# Patient Record
Sex: Male | Born: 1955
Health system: Southern US, Community
[De-identification: ages and names within clinical notes are randomized; demographics above are authoritative.]

---

## 2009-03-29 ENCOUNTER — Emergency Department (HOSPITAL_BASED_OUTPATIENT_CLINIC_OR_DEPARTMENT_OTHER): Admission: EM | Admit: 2009-03-29 | Discharge: 2009-03-29 | Payer: Self-pay | Admitting: Emergency Medicine

## 2009-03-29 ENCOUNTER — Ambulatory Visit: Payer: Self-pay | Admitting: Diagnostic Radiology

## 2010-01-30 IMAGING — CR DG CHEST 1V
1 series · 1 of 1 positions shown · non-contrast
Comparison: Portable chest obtained earlier today.

CLINICAL DATA: Mid chest pain and nausea.

CHEST - 1 VIEW

[w chest lat]
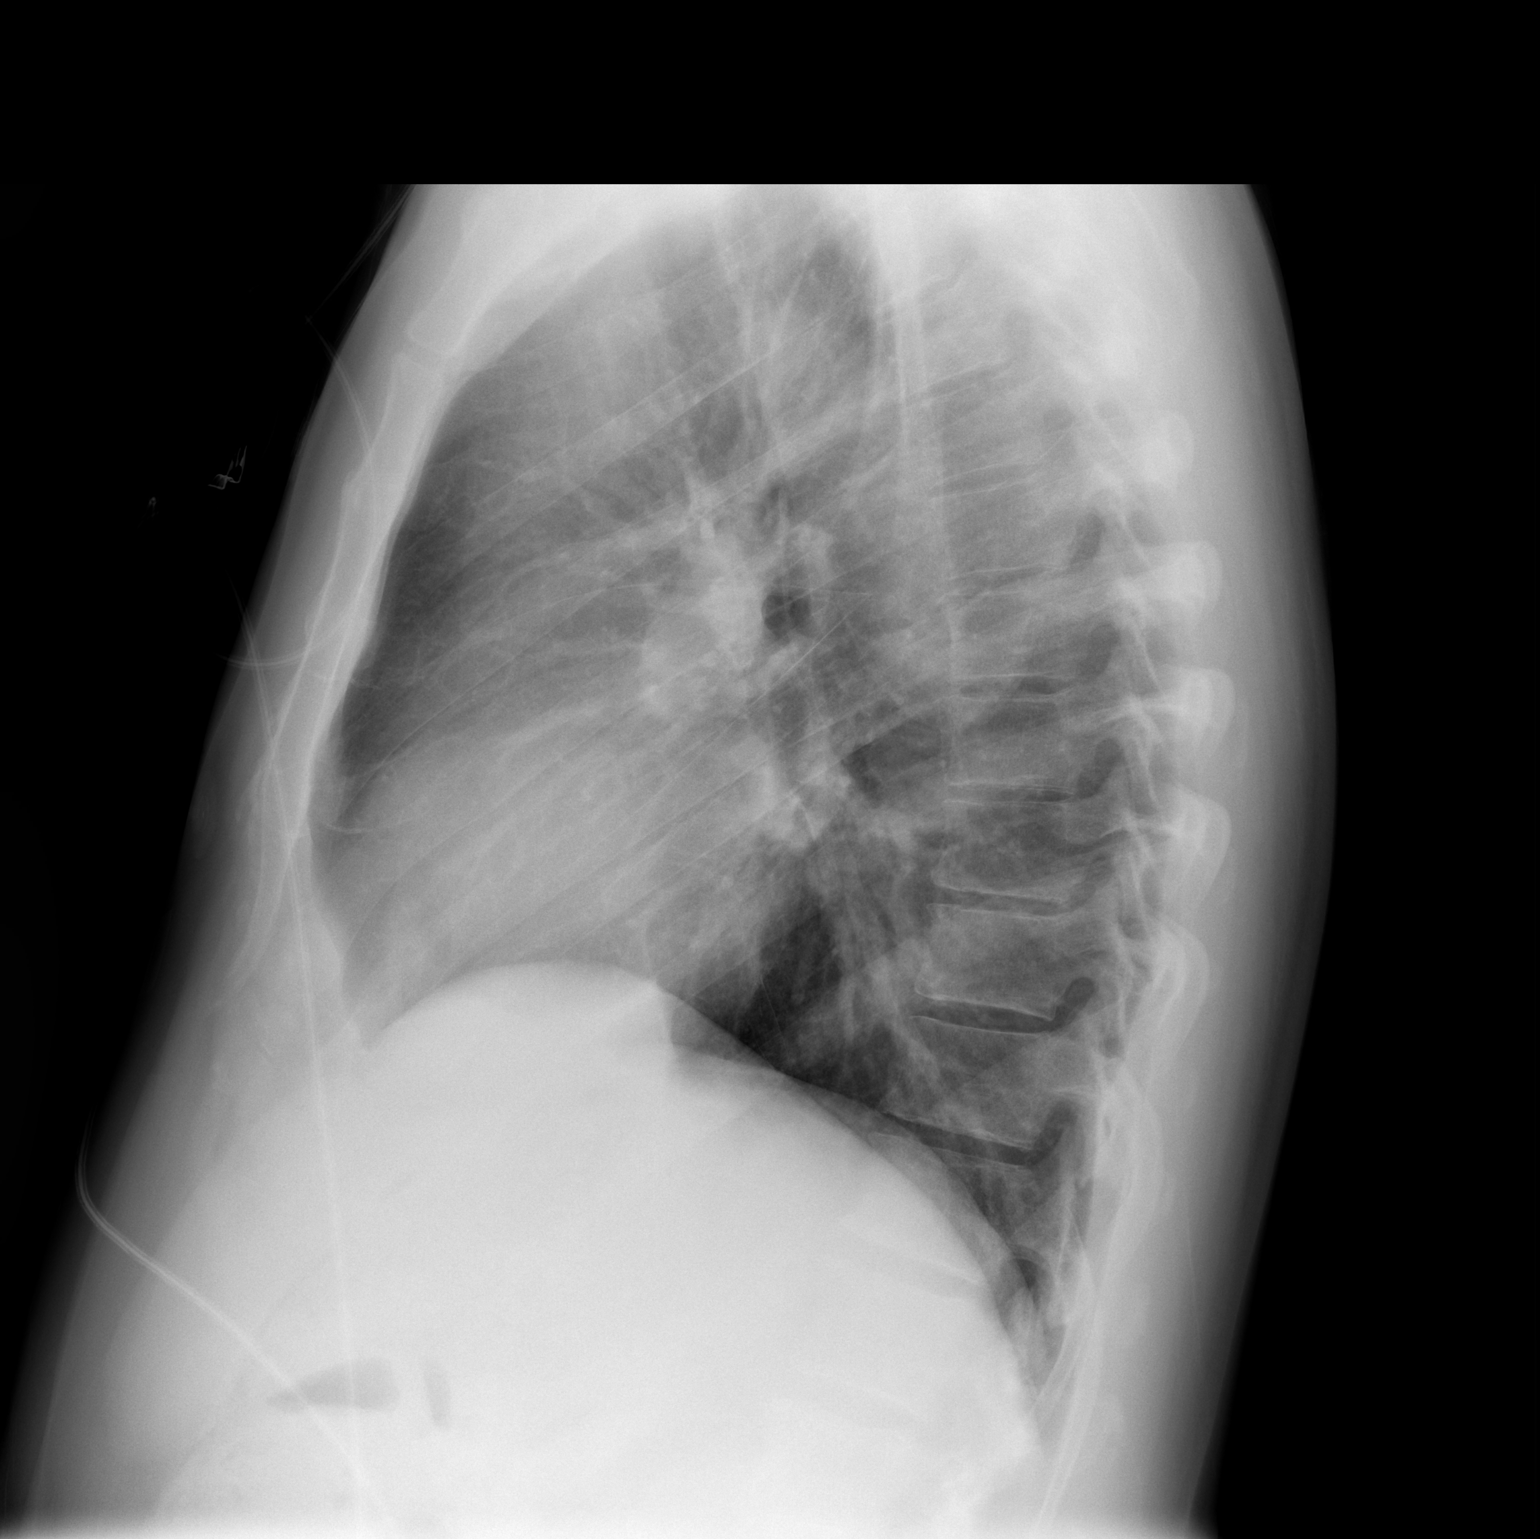

[1 of 1 positions shown; findings below may reference images not displayed]

FINDINGS: A lateral view of the chest demonstrates clear lungs and
normal appearing bones.  Normal sized heart.
IMPRESSION: Normal examination.

## 2011-02-01 LAB — CBC
Hemoglobin: 14.3 g/dL (ref 13.0–17.0)
MCHC: 33.7 g/dL (ref 30.0–36.0)
MCV: 87.1 fL (ref 78.0–100.0)
RBC: 4.87 MIL/uL (ref 4.22–5.81)
WBC: 13.2 10*3/uL — ABNORMAL HIGH (ref 4.0–10.5)

## 2011-02-01 LAB — D-DIMER, QUANTITATIVE: D-Dimer, Quant: <0.22 ug/mL-FEU (ref 0.00–0.48)

## 2011-02-01 LAB — DIFFERENTIAL
Basophils Absolute: 0.1 10*3/uL (ref 0.0–0.1)
Eosinophils Absolute: 0.1 10*3/uL (ref 0.0–0.7)
Eosinophils Relative: 1 % (ref 0–5)
Lymphocytes Relative: 11 % — ABNORMAL LOW (ref 12–46)
Lymphs Abs: 1.4 10*3/uL (ref 0.7–4.0)
Neutrophils Relative %: 81 % — ABNORMAL HIGH (ref 43–77)

## 2011-02-01 LAB — POCT CARDIAC MARKERS: CKMB, poc: <1 ng/mL — ABNORMAL LOW (ref 1.0–8.0)

## 2011-02-01 LAB — COMPREHENSIVE METABOLIC PANEL
ALT: 19 U/L (ref 0–53)
CO2: 27 mEq/L (ref 19–32)
Calcium: 8.7 mg/dL (ref 8.4–10.5)
Chloride: 105 mEq/L (ref 96–112)
Creatinine, Ser: 1 mg/dL (ref 0.4–1.5)
GFR calc non Af Amer: >60 mL/min (ref >60)
Glucose, Bld: 134 mg/dL — ABNORMAL HIGH (ref 70–99)
Total Bilirubin: 0.4 mg/dL (ref 0.3–1.2)

## 2011-02-01 LAB — LIPASE, BLOOD: Lipase: 65 U/L (ref 23–300)

## 2017-02-03 DIAGNOSIS — M25551 Pain in right hip: Secondary | ICD-10-CM | POA: Diagnosis not present

## 2017-02-03 DIAGNOSIS — R05 Cough: Secondary | ICD-10-CM | POA: Diagnosis not present

## 2017-02-05 DIAGNOSIS — M25551 Pain in right hip: Secondary | ICD-10-CM | POA: Diagnosis not present

## 2017-02-07 DIAGNOSIS — S76012D Strain of muscle, fascia and tendon of left hip, subsequent encounter: Secondary | ICD-10-CM | POA: Diagnosis not present

## 2017-02-10 DIAGNOSIS — M25551 Pain in right hip: Secondary | ICD-10-CM | POA: Diagnosis not present

## 2017-02-12 DIAGNOSIS — M25551 Pain in right hip: Secondary | ICD-10-CM | POA: Diagnosis not present

## 2017-02-14 DIAGNOSIS — M7601 Gluteal tendinitis, right hip: Secondary | ICD-10-CM | POA: Diagnosis not present

## 2017-02-14 DIAGNOSIS — M7061 Trochanteric bursitis, right hip: Secondary | ICD-10-CM | POA: Diagnosis not present

## 2017-02-14 DIAGNOSIS — M25551 Pain in right hip: Secondary | ICD-10-CM | POA: Diagnosis not present

## 2017-02-22 DIAGNOSIS — M25551 Pain in right hip: Secondary | ICD-10-CM | POA: Diagnosis not present

## 2017-02-23 DIAGNOSIS — M25551 Pain in right hip: Secondary | ICD-10-CM | POA: Diagnosis not present

## 2017-02-23 DIAGNOSIS — R6889 Other general symptoms and signs: Secondary | ICD-10-CM | POA: Diagnosis not present

## 2017-02-28 DIAGNOSIS — R6889 Other general symptoms and signs: Secondary | ICD-10-CM | POA: Diagnosis not present

## 2017-02-28 DIAGNOSIS — M25551 Pain in right hip: Secondary | ICD-10-CM | POA: Diagnosis not present

## 2017-03-01 DIAGNOSIS — M25551 Pain in right hip: Secondary | ICD-10-CM | POA: Diagnosis not present

## 2017-03-02 DIAGNOSIS — Z1211 Encounter for screening for malignant neoplasm of colon: Secondary | ICD-10-CM | POA: Diagnosis not present

## 2017-03-02 DIAGNOSIS — R0683 Snoring: Secondary | ICD-10-CM | POA: Diagnosis not present

## 2017-03-02 DIAGNOSIS — R5383 Other fatigue: Secondary | ICD-10-CM | POA: Diagnosis not present

## 2017-03-02 DIAGNOSIS — R7989 Other specified abnormal findings of blood chemistry: Secondary | ICD-10-CM | POA: Diagnosis not present

## 2017-03-02 DIAGNOSIS — R7303 Prediabetes: Secondary | ICD-10-CM | POA: Diagnosis not present

## 2017-03-02 DIAGNOSIS — E039 Hypothyroidism, unspecified: Secondary | ICD-10-CM | POA: Diagnosis not present

## 2017-03-02 DIAGNOSIS — E669 Obesity, unspecified: Secondary | ICD-10-CM | POA: Diagnosis not present

## 2017-03-17 DIAGNOSIS — M25551 Pain in right hip: Secondary | ICD-10-CM | POA: Diagnosis not present

## 2017-03-17 DIAGNOSIS — R6889 Other general symptoms and signs: Secondary | ICD-10-CM | POA: Diagnosis not present

## 2017-03-29 DIAGNOSIS — R6889 Other general symptoms and signs: Secondary | ICD-10-CM | POA: Diagnosis not present

## 2017-03-29 DIAGNOSIS — M25551 Pain in right hip: Secondary | ICD-10-CM | POA: Diagnosis not present

## 2017-03-30 DIAGNOSIS — M5127 Other intervertebral disc displacement, lumbosacral region: Secondary | ICD-10-CM | POA: Diagnosis not present

## 2017-03-30 DIAGNOSIS — M47816 Spondylosis without myelopathy or radiculopathy, lumbar region: Secondary | ICD-10-CM | POA: Diagnosis not present

## 2017-03-30 DIAGNOSIS — M5135 Other intervertebral disc degeneration, thoracolumbar region: Secondary | ICD-10-CM | POA: Diagnosis not present

## 2017-03-30 DIAGNOSIS — M545 Low back pain: Secondary | ICD-10-CM | POA: Diagnosis not present

## 2017-03-30 DIAGNOSIS — M5125 Other intervertebral disc displacement, thoracolumbar region: Secondary | ICD-10-CM | POA: Diagnosis not present

## 2017-04-07 DIAGNOSIS — M545 Low back pain: Secondary | ICD-10-CM | POA: Diagnosis not present

## 2017-04-07 DIAGNOSIS — M25551 Pain in right hip: Secondary | ICD-10-CM | POA: Diagnosis not present

## 2017-04-07 DIAGNOSIS — M5416 Radiculopathy, lumbar region: Secondary | ICD-10-CM | POA: Diagnosis not present

## 2017-04-08 DIAGNOSIS — M5416 Radiculopathy, lumbar region: Secondary | ICD-10-CM | POA: Diagnosis not present

## 2017-04-26 DIAGNOSIS — M5416 Radiculopathy, lumbar region: Secondary | ICD-10-CM | POA: Diagnosis not present

## 2017-04-26 DIAGNOSIS — M545 Low back pain: Secondary | ICD-10-CM | POA: Diagnosis not present

## 2017-05-09 DIAGNOSIS — M5416 Radiculopathy, lumbar region: Secondary | ICD-10-CM | POA: Diagnosis not present

## 2017-05-16 DIAGNOSIS — M545 Low back pain: Secondary | ICD-10-CM | POA: Diagnosis not present

## 2017-05-16 DIAGNOSIS — M5126 Other intervertebral disc displacement, lumbar region: Secondary | ICD-10-CM | POA: Diagnosis not present

## 2017-06-01 DIAGNOSIS — M545 Low back pain: Secondary | ICD-10-CM | POA: Diagnosis not present

## 2017-06-01 DIAGNOSIS — M5126 Other intervertebral disc displacement, lumbar region: Secondary | ICD-10-CM | POA: Diagnosis not present

## 2017-06-02 DIAGNOSIS — R7989 Other specified abnormal findings of blood chemistry: Secondary | ICD-10-CM | POA: Diagnosis not present

## 2017-06-02 DIAGNOSIS — Z0000 Encounter for general adult medical examination without abnormal findings: Secondary | ICD-10-CM | POA: Diagnosis not present

## 2017-06-07 DIAGNOSIS — M545 Low back pain: Secondary | ICD-10-CM | POA: Diagnosis not present

## 2017-06-07 DIAGNOSIS — M5126 Other intervertebral disc displacement, lumbar region: Secondary | ICD-10-CM | POA: Diagnosis not present

## 2017-06-17 DIAGNOSIS — M545 Low back pain: Secondary | ICD-10-CM | POA: Diagnosis not present

## 2017-06-17 DIAGNOSIS — M5126 Other intervertebral disc displacement, lumbar region: Secondary | ICD-10-CM | POA: Diagnosis not present

## 2017-06-20 DIAGNOSIS — M545 Low back pain: Secondary | ICD-10-CM | POA: Diagnosis not present

## 2017-06-20 DIAGNOSIS — M5126 Other intervertebral disc displacement, lumbar region: Secondary | ICD-10-CM | POA: Diagnosis not present

## 2017-06-29 DIAGNOSIS — M545 Low back pain: Secondary | ICD-10-CM | POA: Diagnosis not present

## 2017-06-29 DIAGNOSIS — M5126 Other intervertebral disc displacement, lumbar region: Secondary | ICD-10-CM | POA: Diagnosis not present

## 2017-06-29 DIAGNOSIS — M25551 Pain in right hip: Secondary | ICD-10-CM | POA: Diagnosis not present

## 2017-07-01 DIAGNOSIS — M5126 Other intervertebral disc displacement, lumbar region: Secondary | ICD-10-CM | POA: Diagnosis not present

## 2017-07-01 DIAGNOSIS — M545 Low back pain: Secondary | ICD-10-CM | POA: Diagnosis not present

## 2017-07-01 DIAGNOSIS — M25551 Pain in right hip: Secondary | ICD-10-CM | POA: Diagnosis not present

## 2017-07-13 DIAGNOSIS — M5126 Other intervertebral disc displacement, lumbar region: Secondary | ICD-10-CM | POA: Diagnosis not present

## 2017-07-13 DIAGNOSIS — M25551 Pain in right hip: Secondary | ICD-10-CM | POA: Diagnosis not present

## 2017-07-13 DIAGNOSIS — M545 Low back pain: Secondary | ICD-10-CM | POA: Diagnosis not present

## 2017-07-14 ENCOUNTER — Ambulatory Visit (INDEPENDENT_AMBULATORY_CARE_PROVIDER_SITE_OTHER): Payer: BLUE CROSS/BLUE SHIELD | Admitting: Sports Medicine

## 2017-07-14 ENCOUNTER — Ambulatory Visit: Payer: Self-pay

## 2017-07-14 VITALS — BP 140/82 | Ht 73.0 in | Wt 232.0 lb

## 2017-07-14 DIAGNOSIS — M25551 Pain in right hip: Secondary | ICD-10-CM | POA: Diagnosis not present

## 2017-07-14 DIAGNOSIS — M5136 Other intervertebral disc degeneration, lumbar region: Secondary | ICD-10-CM | POA: Diagnosis not present

## 2017-07-14 DIAGNOSIS — M67951 Unspecified disorder of synovium and tendon, right thigh: Secondary | ICD-10-CM

## 2017-07-14 DIAGNOSIS — M6798 Unspecified disorder of synovium and tendon, other site: Secondary | ICD-10-CM

## 2017-07-14 MED ORDER — NITROGLYCERIN 0.2 MG/HR TD PT24: MEDICATED_PATCH | TRANSDERMAL | 1 refills | Status: DC

## 2017-07-14 NOTE — Patient Instructions (Signed)
Nitroglycerin Protocol   Apply 1/4 nitroglycerin patch to affected area daily.  Change position of patch within the affected area every 24 hours.  You may experience a headache during the first 1-2 weeks of using the patch, these should subside.  If you experience headaches after beginning nitroglycerin patch treatment, you may take your preferred over the counter pain reliever.  Another side effect of the nitroglycerin patch is skin irritation or rash related to patch adhesive.  Please notify our office if you develop more severe headaches or rash, and stop the patch.  Tendon healing with nitroglycerin patch may require 12 to 24 weeks depending on the extent of injury.  Men should not use if taking Viagra, Cialis, or Levitra.   Do not use if you have migraines or rosacea.   Do the home exercises as prescribed for your hip  Follow-up with [place] in 6 weeks

## 2017-07-14 NOTE — Assessment & Plan Note (Signed)
I advised him that I thought this has not healed since original MRI  Start HEP program to emphasixe GM rehab Topical NTG protocol OK to walk and do normal activity Repeat US in 6 weeks   If bursal swelling persists try CSI  I spent 40 minutes with this patient. Over 50% of visit was spend in counseling and coordination of care for problems with right lateral hip pain and extensive medical workup of his lumbar spine.

## 2017-07-14 NOTE — Assessment & Plan Note (Signed)
While I think he likely did have a ruptured disc acutely his current sxs do not seem to me to be arising from his lumbar spine  I don't think the PT for this has helped or the injections

## 2017-07-14 NOTE — Progress Notes (Signed)
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 Was trying to get into shape for some events Running step 6 mos ago Tightness in lateral hip Tried to do standing hip rotation swimgs He got acute onset of severe pain   This was bad enough to go to urgent care Difficult to walk upright Cough and sneeze pain Prednisone dose pack - great relief  Evaluated [facility] [place] after pain returned Had MRI of back Herniated disc complex but this was L4/5 Degenerative change in lumbar spine MRI did suggest some GM/ GM tendinopathy  PT for back not helpful Directed Injections to spine did not help Dry needling did not help  Comes for my opinion  Past Hx Heart disease - now stable per patient  ROS Burning down his upper latearl RT hip Does not feel week No groin pain  PE Obese M in NAD/ muscular BP 140/82   Ht  (1.854 m)   Wt 232 lb (105.2 kg)   BMI 30.61 kg/m    Full ROM RT hip Excellent strength of hip flexion/ rotation/ extension Mild TTP on GT on RT Weakness of hip abduction  BAck motion is normal Pain free with back positions and SLR Toe walk Heel walk Tandem walk pain free  Lateral step up and cross over step up cause mild pain  Data reviewed; 6 mos medical records MRI images Injection fluoro  Ultrasound of Right lateral hip  At iliac crest mild swelling of insertion of Gluteus medius Edema in distal gluteus medius Mild edema in gluteus minimus Bursal swelling at insertion of gluteus medius to greater trochanter  Impression gluteus medius chronic tendinopathy with some evidence of bursitis  Ultrasound and interpretation by Peter Huffman. Peter Penna, MD

## 2017-07-19 ENCOUNTER — Encounter: Payer: Self-pay | Admitting: Sports Medicine

## 2017-08-01 DIAGNOSIS — M5126 Other intervertebral disc displacement, lumbar region: Secondary | ICD-10-CM | POA: Diagnosis not present

## 2017-08-04 DIAGNOSIS — M545 Low back pain: Secondary | ICD-10-CM | POA: Diagnosis not present

## 2017-08-04 DIAGNOSIS — M5126 Other intervertebral disc displacement, lumbar region: Secondary | ICD-10-CM | POA: Diagnosis not present

## 2017-08-10 DIAGNOSIS — M25551 Pain in right hip: Secondary | ICD-10-CM | POA: Diagnosis not present

## 2017-08-10 DIAGNOSIS — M5126 Other intervertebral disc displacement, lumbar region: Secondary | ICD-10-CM | POA: Diagnosis not present

## 2017-08-15 DIAGNOSIS — M545 Low back pain: Secondary | ICD-10-CM | POA: Diagnosis not present

## 2017-08-15 DIAGNOSIS — M5126 Other intervertebral disc displacement, lumbar region: Secondary | ICD-10-CM | POA: Diagnosis not present

## 2017-08-15 DIAGNOSIS — M25551 Pain in right hip: Secondary | ICD-10-CM | POA: Diagnosis not present

## 2017-08-19 DIAGNOSIS — M5126 Other intervertebral disc displacement, lumbar region: Secondary | ICD-10-CM | POA: Diagnosis not present

## 2017-08-19 DIAGNOSIS — R29898 Other symptoms and signs involving the musculoskeletal system: Secondary | ICD-10-CM | POA: Diagnosis not present

## 2017-08-19 DIAGNOSIS — M25551 Pain in right hip: Secondary | ICD-10-CM | POA: Diagnosis not present

## 2017-08-23 DIAGNOSIS — M25551 Pain in right hip: Secondary | ICD-10-CM | POA: Diagnosis not present

## 2017-08-23 DIAGNOSIS — R29898 Other symptoms and signs involving the musculoskeletal system: Secondary | ICD-10-CM | POA: Diagnosis not present

## 2017-08-23 DIAGNOSIS — M5126 Other intervertebral disc displacement, lumbar region: Secondary | ICD-10-CM | POA: Diagnosis not present

## 2017-08-25 ENCOUNTER — Ambulatory Visit (INDEPENDENT_AMBULATORY_CARE_PROVIDER_SITE_OTHER): Payer: BLUE CROSS/BLUE SHIELD | Admitting: Sports Medicine

## 2017-08-25 DIAGNOSIS — M5136 Other intervertebral disc degeneration, lumbar region: Secondary | ICD-10-CM

## 2017-08-25 DIAGNOSIS — M6798 Unspecified disorder of synovium and tendon, other site: Secondary | ICD-10-CM

## 2017-08-25 DIAGNOSIS — M67951 Unspecified disorder of synovium and tendon, right thigh: Secondary | ICD-10-CM

## 2017-08-25 MED ORDER — NITROGLYCERIN 0.2 MG/HR TD PT24: MEDICATED_PATCH | TRANSDERMAL | 1 refills | Status: DC

## 2017-08-25 NOTE — Progress Notes (Signed)
Peter Huffman is a 61 y.o. male with a history of right gluteus medius tendinopathy (on US and MRI) and lumbar degenerative disk disease (L4/5 herniated disk complex)  who presents for a follow up. He is an avid Armed forces operational officertennis player.   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 Pain used to be 9/10 and is now 1-3/10 in intensity. He is greatly improving with PT twice weekly and he is doing exercises at home. He gets ischemic therapy at PT, as well. Not sure if nitrogolycerin is helping with the pain but would like a refill. He notes that he is better able to move on the tennis court and that his follow through is less painful, but he feels there is more room for improvement. He denies numbness, tingling, shooting pains.   Patient notes that he has had "tenting" of his abdominal muscles when he lays down at night. No abdominal pain, no changes in bowel habits. Endorses 20 lb weight gain since March due to decreased activity.   O:  Gen: Appears well Ab: soft, NTND, tenting of rectus when laying down and flexing abdomen; + umbilical hernia that is reducible / diastasis recti noted  Right Hip -75 degree arc between full internal and external rotation -strength of gluteus medius, maximus, and tensor fascia lata 5/5 -strength in adduction 5/5 FABER limited (about 1.5 hand widths from table) but equal bilaterally MSK, other:  -5/5 strength b/l knee flexion and extension -5/5 strength b/l ankle flexion and extension Neuro: light touch sensation intact and equal in lower extremities  A/P: 61 y.o. male with right gluteus medius tendinopathy and lumbar degenerative disk disease who has greatly improved strength and pain with current PT and NTG patch regimen, though not back to baseline. Also with notable weight gain due to limited activity and consequent diastasis recti and soft tissues around  the umbilicus. Patient would benefit from continued PT and tennis-specific exercises. RTC 4-6 weeks if still in pain.   -NTG patches reordered  -Continue PT; told patient to ask about exercises to help strengthen abdominal wall.  -Tennis specific exercises reviewed -abdominal wall isometric exercises reviewed -Focus on diet changes and exercises to lose weight  I observed and examined the patient with the resident and agree with assessment and plan.  Note reviewed and modified by me. Enid BaasKarl Adar Rase, MD

## 2017-08-25 NOTE — Assessment & Plan Note (Signed)
Bif improvement with HEP/ NTG and now formal PT  Cont this regimen  If not resolved in 2 mos RTC

## 2017-08-25 NOTE — Assessment & Plan Note (Signed)
Stable  I suggested beginning some abdominal exercises, pelvic tilts, flexion stretches as a part of maintenance  Not symptomatic today/  Exam is OK

## 2017-08-27 DIAGNOSIS — M5126 Other intervertebral disc displacement, lumbar region: Secondary | ICD-10-CM | POA: Diagnosis not present

## 2017-08-27 DIAGNOSIS — M25551 Pain in right hip: Secondary | ICD-10-CM | POA: Diagnosis not present

## 2017-08-27 DIAGNOSIS — R29898 Other symptoms and signs involving the musculoskeletal system: Secondary | ICD-10-CM | POA: Diagnosis not present

## 2017-08-27 DIAGNOSIS — M545 Low back pain: Secondary | ICD-10-CM | POA: Diagnosis not present

## 2017-08-30 DIAGNOSIS — M545 Low back pain: Secondary | ICD-10-CM | POA: Diagnosis not present

## 2017-08-30 DIAGNOSIS — M5126 Other intervertebral disc displacement, lumbar region: Secondary | ICD-10-CM | POA: Diagnosis not present

## 2017-08-30 DIAGNOSIS — R29898 Other symptoms and signs involving the musculoskeletal system: Secondary | ICD-10-CM | POA: Diagnosis not present

## 2017-08-30 DIAGNOSIS — M25551 Pain in right hip: Secondary | ICD-10-CM | POA: Diagnosis not present

## 2017-09-06 DIAGNOSIS — M5126 Other intervertebral disc displacement, lumbar region: Secondary | ICD-10-CM | POA: Diagnosis not present

## 2017-09-06 DIAGNOSIS — M25551 Pain in right hip: Secondary | ICD-10-CM | POA: Diagnosis not present

## 2017-09-06 DIAGNOSIS — R29898 Other symptoms and signs involving the musculoskeletal system: Secondary | ICD-10-CM | POA: Diagnosis not present

## 2017-09-07 DIAGNOSIS — L820 Inflamed seborrheic keratosis: Secondary | ICD-10-CM | POA: Diagnosis not present

## 2017-09-07 DIAGNOSIS — Z872 Personal history of diseases of the skin and subcutaneous tissue: Secondary | ICD-10-CM | POA: Diagnosis not present

## 2017-09-07 DIAGNOSIS — R209 Unspecified disturbances of skin sensation: Secondary | ICD-10-CM | POA: Diagnosis not present

## 2017-09-07 DIAGNOSIS — L814 Other melanin hyperpigmentation: Secondary | ICD-10-CM | POA: Diagnosis not present

## 2017-09-07 DIAGNOSIS — L570 Actinic keratosis: Secondary | ICD-10-CM | POA: Diagnosis not present

## 2017-09-07 DIAGNOSIS — L821 Other seborrheic keratosis: Secondary | ICD-10-CM | POA: Diagnosis not present

## 2017-09-07 DIAGNOSIS — R238 Other skin changes: Secondary | ICD-10-CM | POA: Diagnosis not present

## 2017-09-07 DIAGNOSIS — D485 Neoplasm of uncertain behavior of skin: Secondary | ICD-10-CM | POA: Diagnosis not present

## 2017-09-07 DIAGNOSIS — D2261 Melanocytic nevi of right upper limb, including shoulder: Secondary | ICD-10-CM | POA: Diagnosis not present

## 2017-09-07 DIAGNOSIS — Z789 Other specified health status: Secondary | ICD-10-CM | POA: Diagnosis not present

## 2017-09-09 DIAGNOSIS — M5126 Other intervertebral disc displacement, lumbar region: Secondary | ICD-10-CM | POA: Diagnosis not present

## 2017-09-09 DIAGNOSIS — R29898 Other symptoms and signs involving the musculoskeletal system: Secondary | ICD-10-CM | POA: Diagnosis not present

## 2017-09-09 DIAGNOSIS — M25551 Pain in right hip: Secondary | ICD-10-CM | POA: Diagnosis not present

## 2017-09-13 DIAGNOSIS — R29898 Other symptoms and signs involving the musculoskeletal system: Secondary | ICD-10-CM | POA: Diagnosis not present

## 2017-09-13 DIAGNOSIS — M25551 Pain in right hip: Secondary | ICD-10-CM | POA: Diagnosis not present

## 2017-09-13 DIAGNOSIS — M5126 Other intervertebral disc displacement, lumbar region: Secondary | ICD-10-CM | POA: Diagnosis not present

## 2017-09-13 DIAGNOSIS — M545 Low back pain: Secondary | ICD-10-CM | POA: Diagnosis not present

## 2017-09-20 DIAGNOSIS — M545 Low back pain: Secondary | ICD-10-CM | POA: Diagnosis not present

## 2017-09-20 DIAGNOSIS — M5126 Other intervertebral disc displacement, lumbar region: Secondary | ICD-10-CM | POA: Diagnosis not present

## 2017-09-20 DIAGNOSIS — R29898 Other symptoms and signs involving the musculoskeletal system: Secondary | ICD-10-CM | POA: Diagnosis not present

## 2017-09-20 DIAGNOSIS — M25551 Pain in right hip: Secondary | ICD-10-CM | POA: Diagnosis not present

## 2017-09-22 DIAGNOSIS — M25551 Pain in right hip: Secondary | ICD-10-CM | POA: Diagnosis not present

## 2017-09-22 DIAGNOSIS — M545 Low back pain: Secondary | ICD-10-CM | POA: Diagnosis not present

## 2017-09-22 DIAGNOSIS — M5126 Other intervertebral disc displacement, lumbar region: Secondary | ICD-10-CM | POA: Diagnosis not present

## 2017-09-22 DIAGNOSIS — R29898 Other symptoms and signs involving the musculoskeletal system: Secondary | ICD-10-CM | POA: Diagnosis not present

## 2017-09-27 DIAGNOSIS — M5126 Other intervertebral disc displacement, lumbar region: Secondary | ICD-10-CM | POA: Diagnosis not present

## 2017-09-27 DIAGNOSIS — M25551 Pain in right hip: Secondary | ICD-10-CM | POA: Diagnosis not present

## 2017-09-27 DIAGNOSIS — M545 Low back pain: Secondary | ICD-10-CM | POA: Diagnosis not present

## 2017-09-27 DIAGNOSIS — R29898 Other symptoms and signs involving the musculoskeletal system: Secondary | ICD-10-CM | POA: Diagnosis not present

## 2017-09-29 DIAGNOSIS — M25551 Pain in right hip: Secondary | ICD-10-CM | POA: Diagnosis not present

## 2017-09-29 DIAGNOSIS — R29898 Other symptoms and signs involving the musculoskeletal system: Secondary | ICD-10-CM | POA: Diagnosis not present

## 2017-09-29 DIAGNOSIS — M5126 Other intervertebral disc displacement, lumbar region: Secondary | ICD-10-CM | POA: Diagnosis not present

## 2017-10-06 ENCOUNTER — Encounter: Payer: Self-pay | Admitting: Sports Medicine

## 2017-10-06 ENCOUNTER — Ambulatory Visit: Payer: BLUE CROSS/BLUE SHIELD | Admitting: Sports Medicine

## 2017-10-06 DIAGNOSIS — M25551 Pain in right hip: Secondary | ICD-10-CM | POA: Diagnosis not present

## 2017-10-06 DIAGNOSIS — M6798 Unspecified disorder of synovium and tendon, other site: Secondary | ICD-10-CM

## 2017-10-06 DIAGNOSIS — M545 Low back pain: Secondary | ICD-10-CM | POA: Diagnosis not present

## 2017-10-06 DIAGNOSIS — M67951 Unspecified disorder of synovium and tendon, right thigh: Secondary | ICD-10-CM

## 2017-10-06 DIAGNOSIS — M25539 Pain in unspecified wrist: Secondary | ICD-10-CM | POA: Diagnosis not present

## 2017-10-06 DIAGNOSIS — R29898 Other symptoms and signs involving the musculoskeletal system: Secondary | ICD-10-CM | POA: Diagnosis not present

## 2017-10-06 DIAGNOSIS — M5126 Other intervertebral disc displacement, lumbar region: Secondary | ICD-10-CM | POA: Diagnosis not present

## 2017-10-06 NOTE — Assessment & Plan Note (Signed)
R gluteus medius tendinopathy and lumbar degenerative disc disease improved but with some pain after exacerbation from some particular PT exercises. Hip stability is much improved and patient is ready to transition to home exercises. Discussed using last PT appointment to target exercises that would be the most beneficial for him to continue for another 6-8 weeks. Patient will also start tennis specific exercises on court for movement and stability.

## 2017-10-06 NOTE — Progress Notes (Signed)
   HPI  CC: follow up for R hip and back pain  Here for follow up on R gluteus medius tendinopathy and lumber degenerative disc disease with L4-5 herniated disc complex. 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   Had been doing better but the week before tennis had tried the Superman exercises and a side to side exercise with weight that caused his back pain to flare up. Has slowly got some better since with continued PT but still having some pain. Last PT session is coming up and is concerned about continuing with PT or transitioning to home exercises. No numbness or tingling or paraesthesias.   Around the same time of back pain exacerbation and been hitting some tennis balls and felt a impact with wrist pain. Only has pain on impact of ball on racquet. No weakness. Has good ROM. Has been doing small weight and stretching as per PT recommendations.   Medications/Interventions Tried: PT exercises and NTG protocol  See HPI and/or previous note for associated ROS. No sciatica Feels weakness has resolved  Objective: BP (!) 151/79   Ht 6\' 1"  (1.854 m)   Wt 233 lb (105.7 kg)   BMI 30.74 kg/m  Gen: R-Hand Dominant. NAD, well groomed, a/o x3, normal affect.  CV: Well-perfused. Warm.  Resp: Non-labored.  Neuro: Sensation intact throughout. No gross coordination deficits.  Gait: Nonpathologic posture, unremarkable stride without signs of limp or balance issues. Good toe walk and heel walk. Hip, R: No TTP over R hip or lower back. No obvious rash, erythema, ecchymosis, or edema.  Strength 5/5 in IR/ER/Flex/Ext/Abd/Add. Pelvic alignment unremarkable to inspection and palpation. Standing hip rotation and gait without trendelenburg / unsteadiness.  Functional exercise Lateral step up Cross over step up Cross over walk Lunge 1 leg knee bend  Can do all of these without pain  Assessment and plan:  Tendinopathy of right gluteus medius R gluteus  medius tendinopathy and lumbar degenerative disc disease improved but with some pain after exacerbation from some particular PT exercises. Hip stability is much improved and patient is ready to transition to home exercises. Discussed using last PT appointment to target exercises that would be the most beneficial for him to continue for another 6-8 weeks. Patient will also start tennis specific exercises on court for movement and stability.   R wrist lunate triquetrum ligamentous strain.  Wrap wrist loop given todaKarly to protect wrist for several weeks until wrists strain resolves.  Follow up in 6 weeks.  Leland HerElsia J Sang Blount, DO PGY-2, [facility] 10/06/2017 5:02 PM   I observed and examined the patient with the resident and agree with assessment and plan.  Note reviewed and modified by me. Enid BaasKarl Fields, MD

## 2017-10-14 DIAGNOSIS — R29898 Other symptoms and signs involving the musculoskeletal system: Secondary | ICD-10-CM | POA: Diagnosis not present

## 2017-10-14 DIAGNOSIS — M5126 Other intervertebral disc displacement, lumbar region: Secondary | ICD-10-CM | POA: Diagnosis not present

## 2017-10-14 DIAGNOSIS — M25551 Pain in right hip: Secondary | ICD-10-CM | POA: Diagnosis not present

## 2017-10-14 DIAGNOSIS — M545 Low back pain: Secondary | ICD-10-CM | POA: Diagnosis not present

## 2017-11-19 DIAGNOSIS — M5126 Other intervertebral disc displacement, lumbar region: Secondary | ICD-10-CM | POA: Diagnosis not present

## 2017-11-19 DIAGNOSIS — M5412 Radiculopathy, cervical region: Secondary | ICD-10-CM | POA: Diagnosis not present

## 2017-11-19 DIAGNOSIS — M25551 Pain in right hip: Secondary | ICD-10-CM | POA: Diagnosis not present

## 2017-11-19 DIAGNOSIS — M545 Low back pain: Secondary | ICD-10-CM | POA: Diagnosis not present

## 2017-12-02 DIAGNOSIS — M545 Low back pain: Secondary | ICD-10-CM | POA: Diagnosis not present

## 2017-12-02 DIAGNOSIS — M5412 Radiculopathy, cervical region: Secondary | ICD-10-CM | POA: Diagnosis not present

## 2017-12-02 DIAGNOSIS — M5126 Other intervertebral disc displacement, lumbar region: Secondary | ICD-10-CM | POA: Diagnosis not present

## 2017-12-02 DIAGNOSIS — M25551 Pain in right hip: Secondary | ICD-10-CM | POA: Diagnosis not present

## 2017-12-08 DIAGNOSIS — M5412 Radiculopathy, cervical region: Secondary | ICD-10-CM | POA: Diagnosis not present

## 2017-12-08 DIAGNOSIS — M545 Low back pain: Secondary | ICD-10-CM | POA: Diagnosis not present

## 2017-12-08 DIAGNOSIS — M5126 Other intervertebral disc displacement, lumbar region: Secondary | ICD-10-CM | POA: Diagnosis not present

## 2017-12-08 DIAGNOSIS — M25551 Pain in right hip: Secondary | ICD-10-CM | POA: Diagnosis not present

## 2017-12-13 ENCOUNTER — Encounter: Payer: Self-pay | Admitting: Sports Medicine

## 2017-12-13 ENCOUNTER — Ambulatory Visit: Payer: BLUE CROSS/BLUE SHIELD | Admitting: Sports Medicine

## 2017-12-13 DIAGNOSIS — M5412 Radiculopathy, cervical region: Secondary | ICD-10-CM | POA: Insufficient documentation

## 2017-12-13 NOTE — Assessment & Plan Note (Signed)
Given numbness and tingling with no accompanying symptoms, most likely radiculopathy. Distribution of numbness extending from shoulder to thumb but not affecting other digits most consistent with C5-6 involvement. Patient likely with some degree of neck arthritis which may be contributing as well. Will begin conservative management with isometric exercises, gentle neck ROM, ibuprofen, and Vitamin B6 100mg  qd. If no improvement, can consider beginning gabapentin.

## 2017-12-13 NOTE — Patient Instructions (Signed)
It was nice meeting you today Mr. Peter Huffman!  Please begin taking Vitamin B6 100 mg daily. You can also take ibuprofen as needed for pain. It will also be helpful to do the isometric exercises in the handout at least once daily. Gentle range of motion exercises (turning your head to each side, putting your chin to your chest, looking up at the ceiling), will also help relieve the tightness in your neck. You can also continue to do gentle range of motion exercises at the hip to keep your back loose (bending forward, arching back, leaning side to side, being sure to always bend at the hip).   If you are not feeling better in 4-6 weeks, please let us know.   If you have any questions or concerns, please feel free to call the clinic.   Be well,  Dr. Natale MilchLancaster

## 2017-12-13 NOTE — Progress Notes (Signed)
   Subjective:    Patient ID: Peter Huffman, male    DOB: 20-Feb-1956, 62 y.o.   MRN: [id number]  HPI  Peter Huffman is a 62yo M presenting for R arm numbness.   Began about a month ago. Patient was doing Cobra movement as part of physical therapy for back pain, when he noted a pain in his R shoulder. Told his physical therapist about this, who recommended neck exercises. As patient was performing neck exercises with PT, noted numbness and tingling in the R arm, so was told to discontinue this exercise and f/u with Dr. Darrick PennaFields.  Patient has continued to have numbness and tingling of the R arm, which extends from his shoulder to his fingers. Was occurring up to 25-30x/d, no occurring about 10x/d. Lasts for 5-30 seconds then resolves spontaneously. He can recreate numbness by pressing on his shoulder. Also has onset of numbness when reaching overhead. Denies any arm weakness, and has not been dropping things.   Review of Systems MSK: Denies weakness of RUE. Endorses limited ROM of neck.     Objective:   Physical Exam  Constitutional: He is oriented to person, place, and time. He appears well-developed and well-nourished. No distress.  Musculoskeletal:  Limited flexion, extension, lateral movement of neck on R. Full ROM of RUE. Normal reflexes of upper extremities bilaterally. 5/5 strength upper extremities bilaterally. No scapular instability. Decreased sensation to light touch on R lateral arm as well as R thumb.   Neurological: He is alert and oriented to person, place, and time.  Psychiatric: He has a normal mood and affect. His behavior is normal.      Assessment & Plan:  C5-6 Radiculopathy Given numbness and tingling with no accompanying symptoms, most likely radiculopathy. Distribution of numbness extending from shoulder to thumb but not affecting other digits most consistent with C5-6 involvement. Patient likely with some degree of neck arthritis which may be contributing as well. Will  begin conservative management with isometric exercises, gentle neck ROM, ibuprofen, and Vitamin B6 100mg  qd. If no improvement, can consider beginning gabapentin.   Tarri AbernethyAbigail J Lancaster, MD, MPH PGY-3 [facility] Pager [phone]  I observed and examined the patient with the resident and agree with assessment and plan.  Note reviewed and modified by me. Enid BaasKarl Zyrion Coey, MD

## 2017-12-16 DIAGNOSIS — M5412 Radiculopathy, cervical region: Secondary | ICD-10-CM | POA: Diagnosis not present

## 2017-12-16 DIAGNOSIS — M25551 Pain in right hip: Secondary | ICD-10-CM | POA: Diagnosis not present

## 2017-12-16 DIAGNOSIS — R29898 Other symptoms and signs involving the musculoskeletal system: Secondary | ICD-10-CM | POA: Diagnosis not present

## 2017-12-16 DIAGNOSIS — M5126 Other intervertebral disc displacement, lumbar region: Secondary | ICD-10-CM | POA: Diagnosis not present

## 2018-01-02 DIAGNOSIS — J069 Acute upper respiratory infection, unspecified: Secondary | ICD-10-CM | POA: Diagnosis not present

## 2018-01-02 DIAGNOSIS — I2510 Atherosclerotic heart disease of native coronary artery without angina pectoris: Secondary | ICD-10-CM | POA: Diagnosis not present

## 2018-01-02 DIAGNOSIS — M545 Low back pain: Secondary | ICD-10-CM | POA: Diagnosis not present

## 2018-01-02 DIAGNOSIS — R7303 Prediabetes: Secondary | ICD-10-CM | POA: Diagnosis not present

## 2018-01-02 DIAGNOSIS — M5412 Radiculopathy, cervical region: Secondary | ICD-10-CM | POA: Diagnosis not present

## 2018-01-02 DIAGNOSIS — R001 Bradycardia, unspecified: Secondary | ICD-10-CM | POA: Diagnosis not present

## 2018-01-02 DIAGNOSIS — R0609 Other forms of dyspnea: Secondary | ICD-10-CM | POA: Diagnosis not present

## 2018-01-13 DIAGNOSIS — M545 Low back pain: Secondary | ICD-10-CM | POA: Diagnosis not present

## 2018-01-13 DIAGNOSIS — M25551 Pain in right hip: Secondary | ICD-10-CM | POA: Diagnosis not present

## 2018-01-13 DIAGNOSIS — M5126 Other intervertebral disc displacement, lumbar region: Secondary | ICD-10-CM | POA: Diagnosis not present

## 2018-01-13 DIAGNOSIS — M5412 Radiculopathy, cervical region: Secondary | ICD-10-CM | POA: Diagnosis not present

## 2018-01-23 DIAGNOSIS — M5126 Other intervertebral disc displacement, lumbar region: Secondary | ICD-10-CM | POA: Diagnosis not present

## 2018-01-23 DIAGNOSIS — M545 Low back pain: Secondary | ICD-10-CM | POA: Diagnosis not present

## 2018-01-23 DIAGNOSIS — M5136 Other intervertebral disc degeneration, lumbar region: Secondary | ICD-10-CM | POA: Diagnosis not present

## 2018-01-23 DIAGNOSIS — M48061 Spinal stenosis, lumbar region without neurogenic claudication: Secondary | ICD-10-CM | POA: Diagnosis not present

## 2018-02-15 DIAGNOSIS — M25551 Pain in right hip: Secondary | ICD-10-CM | POA: Diagnosis not present

## 2018-02-15 DIAGNOSIS — M5126 Other intervertebral disc displacement, lumbar region: Secondary | ICD-10-CM | POA: Diagnosis not present

## 2018-02-15 DIAGNOSIS — M545 Low back pain: Secondary | ICD-10-CM | POA: Diagnosis not present

## 2018-02-15 DIAGNOSIS — M5412 Radiculopathy, cervical region: Secondary | ICD-10-CM | POA: Diagnosis not present

## 2018-02-16 ENCOUNTER — Ambulatory Visit: Payer: BLUE CROSS/BLUE SHIELD | Admitting: Sports Medicine

## 2018-02-16 VITALS — BP 122/72 | Ht 73.0 in | Wt 239.0 lb

## 2018-02-16 DIAGNOSIS — M5126 Other intervertebral disc displacement, lumbar region: Secondary | ICD-10-CM | POA: Diagnosis not present

## 2018-02-16 DIAGNOSIS — I2510 Atherosclerotic heart disease of native coronary artery without angina pectoris: Secondary | ICD-10-CM | POA: Diagnosis not present

## 2018-02-16 DIAGNOSIS — M25531 Pain in right wrist: Secondary | ICD-10-CM | POA: Diagnosis not present

## 2018-02-16 DIAGNOSIS — M5136 Other intervertebral disc degeneration, lumbar region: Secondary | ICD-10-CM | POA: Diagnosis not present

## 2018-02-16 DIAGNOSIS — G8929 Other chronic pain: Secondary | ICD-10-CM | POA: Diagnosis not present

## 2018-02-16 DIAGNOSIS — M545 Low back pain: Secondary | ICD-10-CM | POA: Diagnosis not present

## 2018-02-16 NOTE — Assessment & Plan Note (Addendum)
Patient presents with continued wrist pain from hyperextension a few years while playing tennis. Patient has used a wrist loop but is still complaining of wrist tenderness. On exam, patient with good ROM but tender around ulnar styloid and TFCC --Recommend flexible tapping around wrist to provided more restriction in wrist extension since loop wrist did not provide sufficient restriction. --Follow up as needed

## 2018-02-16 NOTE — Assessment & Plan Note (Signed)
Patient presented today to discuss recent MRI results from lumbar spine which showed multilevel degenerative disc disease in addition to spinal canal stenosis and multiple protruding disc. Patient denies any radiculopathy and seem to benefit very little from PT for the past few month. 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 Do not think that patient will benefit from surgical intervention given multilevel protruding disc and with spinal stenosis and arthritic changes. --Recommended abdominal/core exercises to help with spinal stenosis --Continue PT  --[facility] --Patient can resume light ball hitting as much as tolerated and gradually increase --Discontinue gabapentin given lack of radicular symptoms --Follow up with Dr. Darrick PennaFields in 6 weeks for reassessment

## 2018-02-16 NOTE — Progress Notes (Signed)
   Subjective:    Patient ID: Peter Huffman, male    DOB: Nov 09, 1955, 62 y.o.   MRN: [id number]   CC: Follow for low back pain and right wrist pain  HPI: Patient is a 62 yo male who presents today to discuss recent MRI results for low back pain and chronic right wrist pain.  Low back pain: Patient present here today to discuss recent MRI results of his lumbar  [facility]. Patient would like Dr.Virgie Kunda opinion on the results. Patient has had chronic low back pain with a bulging disc in his back. He has been undergoing Physical therapy in [place] for the past few months without any significant change in his symptoms. Patient reports mostly localized lumbar pain with occssional hip radiation but no radiculopathy down his lower extremities. He was prescribed gabapentin by his PCP but has stop taking to to side effect (foggy)  Right wrist pain: Patient reports right wrist pain with tenderness mostly in the ulnar distribution. Patient had an injury about a year and half ago playing tennis when he overextended his wrist. Patient has used a wrist loop to help with symptoms with minimal improvement in symptoms.     ROS: all other systems were reviewed and are negative other than in the HPI  No swelling in wrist No weakness in lower leg or true sciatica   Past medical history, surgical, family, and social history reviewed and updated in the EMR as appropriate. Review annual physical 8/18  Objective:  BP 122/72   Ht 6\' 1"  (1.854 m)   Wt 239 lb (108.4 kg)   BMI 31.53 kg/m   Vitals and nursing note reviewed  General: NAD, pleasant, able to participate in exam Wrist exam: No deformities, swelling or effusion noted. Tender to palpation dorsal and volar aspect of right wrist in the ulnar distribution. Minimal limitation in range of motion. Strength and sensation intact. Palpable pulses  Low Back No spasm or acute pain today Good flexion with no  pain   Assessment & Plan:    Chronic pain of right wrist Patient presents with continued wrist pain from overextension a few years while playing tennis. Patient has used a wrist loop but is still complaining of wrist tenderness. On exam, patient with good ROM but tender around ulnar head. --Recommend flexible tapping around wrist to provided more restriction in wrist extension since loop wrist did not provide sufficient restriction. --Follow up as needed  Lumbar degenerative disc disease Patient presented today to discuss recent MRI results from lumbar spine which showed multilevel degenerative disc disease in addition to spinal canal stenosis and multiple protruding disc. Patient denies any radiculopathy and seem to benefit very little from PT for the past few month. 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 Do not think that patient will benefit from surgical intervention given multilevel protruding disc and with spinal stenosis and arthritic changes. --Recommended abdominal/core exercises to help with spinal stenosis --Continue PT  --[facility] --Patient can resume light ball hitting as much as tolerated and gradually increase --Discontinue gabapentin given lack of radicular symptoms --Follow up with Dr. Darrick PennaFields in 6 weeks for reassessment      Lovena NeighboursAbdoulaye Diallo, MD [facility] PGY-2 I observed and examined the patient with the resident and agree with assessment and plan.  Note reviewed and modified by me. Enid BaasKarl Ender Rorke, MD

## 2018-02-20 DIAGNOSIS — G8929 Other chronic pain: Secondary | ICD-10-CM | POA: Diagnosis not present

## 2018-02-20 DIAGNOSIS — M5441 Lumbago with sciatica, right side: Secondary | ICD-10-CM | POA: Diagnosis not present

## 2018-03-27 DIAGNOSIS — M5136 Other intervertebral disc degeneration, lumbar region: Secondary | ICD-10-CM | POA: Diagnosis not present

## 2018-03-27 DIAGNOSIS — M9904 Segmental and somatic dysfunction of sacral region: Secondary | ICD-10-CM | POA: Diagnosis not present

## 2018-03-27 DIAGNOSIS — M4726 Other spondylosis with radiculopathy, lumbar region: Secondary | ICD-10-CM | POA: Diagnosis not present

## 2018-03-27 DIAGNOSIS — M9903 Segmental and somatic dysfunction of lumbar region: Secondary | ICD-10-CM | POA: Diagnosis not present

## 2018-03-28 ENCOUNTER — Encounter: Payer: Self-pay | Admitting: Sports Medicine

## 2018-03-28 ENCOUNTER — Ambulatory Visit: Payer: BLUE CROSS/BLUE SHIELD | Admitting: Sports Medicine

## 2018-03-28 DIAGNOSIS — M5136 Other intervertebral disc degeneration, lumbar region: Secondary | ICD-10-CM

## 2018-03-28 MED ORDER — CYCLOBENZAPRINE HCL 10 MG PO TABS: 10.0000 mg | ORAL_TABLET | Freq: Every evening | ORAL | 0 refills | Status: DC | PRN

## 2018-03-28 NOTE — Assessment & Plan Note (Signed)
Cont to use HEP and given a series of maintenance exercises Trial of flexeril at HS  Walking and diet for weight loss  Re Ck in 1 -3 mos depending sxs

## 2018-03-28 NOTE — Progress Notes (Signed)
   Subjective:    Patient ID: Peter Huffman, male    DOB: 07-May-1956, 62 y.o.   MRN: [id number]  HPI Pt presents due to continued low back pain. He had a recent MRI which he brought with him on CD. Lumbar pain radiates to his R hip. No leg pain. No buttock pain. No numbness or tingling. No saddle anesthesia. No bowel or bladder incontinence. His commute is about an hour each way. He keeps his wallet in his front pocket. He can't describe the back pain well aside from it is "ongoing." It is sharp at times and interferes with his sleep.   Pain seated currently is 0.5/10. Certain mvmts such as turning and activities such as mowing or lifting make him hurt more. He has been dealing with this for over a year and has gained 25-30 lbs due to a decrease in his activity level. He has been previously seen here for his R wrist which was causing him pain due to a tennis overuse injury. He has not been able to play tennis due to his back and when he used the ball machine a couple of weeks ago his wrist was not bothering him.  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, communitycompetitive player Non smoker   Review of Systems As above    Objective:   Physical Exam Obese, seated on the exam table in NAD BP 130/70   Ht 6\' 1"  (1.854 m)   Wt 239 lb (108.4 kg)   BMI 31.53 kg/m   Lumbar vertebrae are NTTP, no stepoff, no paraspinal muscle spasm, R hip is NTTP NL lumbar ROM  NL strength, BLEs are 5/5 with negative B straight leg raises Lumbar spine is stable NVI with no reported radicular sxs aside from some referred R hip pain that is not reproducible on exam  MRI reviewed: 3 levels of lumbar disc bulging  2 levels with anterior herniation Relative spinal narrowing in upper lumbar spine       Assessment & Plan:  MRI reviewed with pt. It shows several small disc herniations, including 2 anterior areas.  Pt given stretching and strengthening exercises to do. Pt advised that he would  benefit from doing yoga or pilates 1-2x/week Advised to take OTC Aleve as needed but not to take it routinely due to being on plavix as there can be an increased risk of bleeding. Will place on Flexeril 10mg  QHS. Return in 4 weeks for re-eval.

## 2018-05-13 ENCOUNTER — Other Ambulatory Visit: Payer: Self-pay | Admitting: Sports Medicine

## 2018-05-18 DIAGNOSIS — M545 Low back pain: Secondary | ICD-10-CM | POA: Diagnosis not present

## 2018-05-18 DIAGNOSIS — E039 Hypothyroidism, unspecified: Secondary | ICD-10-CM | POA: Diagnosis not present

## 2018-05-18 DIAGNOSIS — I10 Essential (primary) hypertension: Secondary | ICD-10-CM | POA: Diagnosis not present

## 2018-05-18 DIAGNOSIS — R7303 Prediabetes: Secondary | ICD-10-CM | POA: Diagnosis not present

## 2018-06-13 ENCOUNTER — Ambulatory Visit: Payer: BLUE CROSS/BLUE SHIELD | Admitting: Sports Medicine

## 2018-06-13 ENCOUNTER — Encounter: Payer: Self-pay | Admitting: Sports Medicine

## 2018-06-13 DIAGNOSIS — M5136 Other intervertebral disc degeneration, lumbar region: Secondary | ICD-10-CM | POA: Diagnosis not present

## 2018-06-13 MED ORDER — AMITRIPTYLINE HCL 25 MG PO TABS: ORAL_TABLET | ORAL | 0 refills | Status: DC

## 2018-06-13 NOTE — Progress Notes (Signed)
CC: Low Back pain  On last visit MRI review showed 3 levels of DDD in lumbar spine We tried flexeril at hs Now wakes up with less stiffness Able to sleep through night Rates pain while sitting as 0/10 (note last time rated 5/10)  Frustrated that pain on hitting tennis serve or OH Pain on 2h backhand  ROS No sciatica Some pain radiates into RT hip laterally  PE Obese w M in nad BP 132/78   Ht 6\' 1"  (1.854 m)   Wt 234 lb (106.1 kg)   BMI 30.87 kg/m    Excellent hip ROM Exc. Hip flexion strength Exc hip abduction str. SLR and neuro exam are neg. SIJ movement is good

## 2018-06-13 NOTE — Assessment & Plan Note (Signed)
We should try stronger dose of meds No side-effects Try switch to amitriptyline 25 hs x 1 wk and then increase to 50 HS Consider yoga or pilates Walking  Reck after 6 wks of meds

## 2018-07-25 ENCOUNTER — Encounter: Payer: Self-pay | Admitting: Sports Medicine

## 2018-07-25 ENCOUNTER — Ambulatory Visit: Payer: BLUE CROSS/BLUE SHIELD | Admitting: Sports Medicine

## 2018-07-25 VITALS — BP 160/80 | Ht 73.0 in | Wt 235.0 lb

## 2018-07-25 DIAGNOSIS — S86119A Strain of other muscle(s) and tendon(s) of posterior muscle group at lower leg level, unspecified leg, initial encounter: Secondary | ICD-10-CM | POA: Diagnosis not present

## 2018-07-25 DIAGNOSIS — M5136 Other intervertebral disc degeneration, lumbar region: Secondary | ICD-10-CM

## 2018-07-25 NOTE — Progress Notes (Addendum)
HPI  CC: Low back pain  Peter Huffman is a 62 year old male with lumbar degenerative disc disease who presents for follow-up of low back pain.  He was last seen on 06/13/2018, and was started on amitriptyline 25 mg for back pain.  He states that this is not helped with his back pain, even when he increased to 50 mg after 1 week.  He is taking the medication consistently every day.  He was previously on gabapentin without any relief.  He states the back pain is worse throughout the day as he walks.  He states the pain is worse when he does any overhead serving in tennis.  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  He states that acutely, he hurt his knee 2 weeks ago.  He states that he was walking on uneven turf when he twisted his knee.  He states he felt pain in the posterior part of his left calf rating up to the back of his knee.  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  He has tried ibuprofen 800 mg twice daily since that time, with relief in the pain.  He states the pain is barely noticeable today.  He states he has had resolution of the pain for around 3 days.  He denies any numbness and tingling down his leg.  He denies any weakness in the leg.  Denies any bowel or urinary incontinence.  He denies any balance of fine motor issues.  See HPI and/or previous note for associated ROS.  Objective: BP (!) 160/80   Ht 6\' 1"  (1.854 m)   Wt 235 lb (106.6 kg)   BMI 31.00 kg/m  Gen: Right-Hand Dominant. NAD, well groomed, a/o x3, normal affect.  CV: Well-perfused. Warm.  Resp: Non-labored.  Neuro: Sensation intact throughout. No gross coordination deficits.  Gait: Nonpathologic posture, unremarkable stride without signs of limp or balance issues.  Back exam: No erythema, warmth, swelling.  Tenderness palpation along the L3-L5 paraspinal muscles.  Full range of motion in forward flexion, extension, rotational  directions.  Strength 5 out of 5 throughout lower extremity testing.  Negative straight leg raise, negative FABER test, negative FADIR test.  Left knee exam: No erythema, warmth, or swelling noted.  Tenderness palpation along the lateral posterior knee near the insertion of the lateral gastroc muscle.  Full range of motion of extension and flexion of the knee.  No pain with resisted range of motion.  Strength 5 out of 5 throughout testing.  Negative Lockman, negative posterior drawer, negative varus stress testing, negative valgus stress testing, negative McMurray test, negative Thessaly test.  Assessment and plan: 1.  Lumbar degenerative disc disease 2.  Left lateral gastrocnemius strain  We discussed treatment options with Peter Huffman at today's visit.  The amitriptyline does not seem to be helping with his low back pain.  We will discontinue at this time.  We have advised him to continue to exercises to strengthen his core, hamstrings, and hip abductors.  He can continue with a list of exercise we provided with him at his last visit.  For his calf strain, he can continue take ibuprofen daily for the next 2 to 3 days as needed.  We also provided with a list of eccentric calf exercises today's visit.  We will follow him up in 3 to 4 months.  If he has no improvement at that time his calf,  we can consider starting the Nitropatch protocol on him.  If he has no improvement with his low back pain at that follow-up, we can consider doing formal physical therapy.  Peter Quan, MD [facility] Sports Medicine Fellow 07/25/2018 10:17 AM  I observed and examined the patient with the [facility] Fellow and agree with assessment and plan.  Note reviewed and modified by me.  Peter Big, MD

## 2018-07-25 NOTE — Patient Instructions (Signed)
Thank you for coming to see [place] today in clinic.  You were seen today for low back pain and left calf strain.  We discussed treatment options with you at today's visit.  We have advised you to go ahead and stop the amitriptyline for the low back pain.  We advised you to continue to do exercises to strengthen your core, hamstrings, and hip abductors.  These are the exercises we provided to the last visit.  For your calf, we advise you to continue to take ibuprofen daily for the next 2 to 3 days for relief.  We also advised to start some eccentric calf exercises, as demonstrated in clinic today.  We will see back in 3 to 4 months for follow-up.

## 2018-07-30 ENCOUNTER — Other Ambulatory Visit: Payer: Self-pay | Admitting: Sports Medicine

## 2018-08-03 DIAGNOSIS — M25462 Effusion, left knee: Secondary | ICD-10-CM | POA: Diagnosis not present

## 2018-08-03 DIAGNOSIS — M25562 Pain in left knee: Secondary | ICD-10-CM | POA: Diagnosis not present

## 2018-08-15 ENCOUNTER — Encounter: Payer: Self-pay | Admitting: Sports Medicine

## 2018-08-15 ENCOUNTER — Ambulatory Visit: Payer: BLUE CROSS/BLUE SHIELD | Admitting: Sports Medicine

## 2018-08-15 VITALS — BP 136/78 | Ht 73.0 in | Wt 235.0 lb

## 2018-08-15 DIAGNOSIS — M7122 Synovial cyst of popliteal space [Baker], left knee: Secondary | ICD-10-CM

## 2018-08-15 MED ORDER — METHYLPREDNISOLONE ACETATE 40 MG/ML IJ SUSP
40.0000 mg | Freq: Once | INTRAMUSCULAR | Status: AC
  Administered 2018-08-15: 40 mg via INTRA_ARTICULAR

## 2018-08-15 NOTE — Patient Instructions (Signed)
Thank you for coming to see [place] today in clinic.  He was diagnosed with a Baker cyst today's visit.  We used ultrasound guidance to inject steroid into the Baker cyst today.  Please keep the area wrapped with Ace wrap over the next week.  We will see you for follow-up in 4 weeks.

## 2018-08-15 NOTE — Progress Notes (Addendum)
HPI  CC: left knee pain  Peter Huffman is a 62 year old male presents for follow-up of left knee pain.  He was seen back on 07/25/2018.  He was found to have a calf strain at that time.  We gave him rehabilitation exercises at that time. He states the pain got acutely worse and he went to Peter Huffman though in the interim.  They performed x-rays that time which showed very mild arthritis in the lateral compartment of his left knee.  He was given a corticosteroid injection at that time.  He states this gave him no relief.  He continues to have pain on the posterior part of his knee.  He states the pain is a fullness to sometimes wraps around the front of his knee.  He denies any numbness and tingling down his leg.  Denies any weakness of the leg.  He has not tried any consistent medication therapy.  States the pain is made worse when he extends his knee or does any prolonged walking.  See HPI and/or previous note for associated ROS.  Objective: BP 136/78   Ht 6\' 1"  (1.854 m)   Wt 235 lb (106.6 kg)   BMI 31.00 kg/m  Gen: right-Hand Dominant. NAD, well groomed, a/o x3, normal affect.  CV: Well-perfused. Warm.  Resp: Non-labored.  Neuro: Sensation intact throughout. No gross coordination deficits.  Gait: Nonpathologic posture, unremarkable stride without signs of limp or balance issues.  Left knee exam: No erythema, warmth, swelling noted.  Tenderness to palpation over the posterior knee.  Mass felt on the medial aspect of the posterior knee.  Full range of motion knee extension and knee flexion.  Strength 5 out of 5 throughout testing.  Negative valgus and varus stress testing.  Negative McMurray test.  negative Homans sign.   Ultrasound knee: 4 cm Baker cyst with the septum attaching a 2 cm Baker cyst visualized on posterior aspect of the knee on the medial aspect between the medial gastroc and the soleus muscle.   Assessment and plan: Baker's Cyst of left knee  Risks, benefits and  complications were reviewed with the patient. After obtaining informed verbal consent the posterior left knee was sterilely prepped with alcohol.  Ethyl chloride was used to anesthetize the skin and injection using a 18-gauge by 1-1/2 inch needle was accomplished without difficulty.  We attempted aspiration, but were unable to aspirate any fluid.  We injected 1 cc methylprednisolone with 2 cc 1% lidocaine without epinephrine.  Patient tolerated the procedure well and was observed for 5-10 min. Post injection instructions, including signs and symptoms of complications were discussed.  We discussed treatment options with Peter Needle at today's visit.  He had a Baker cyst noted on ultrasound as above.  Using ultrasound imaging, we were able to guide our needleinto the cyst and injected 1 cc of steroid.  We placed a Ace wrap over the area to provide compression.  He should limit his activity over the next week.  We believe this Baker cyst secondary to a calf strain with a tear of the capsule.  This was visualized on ultrasound as noted above.  We have advised him to continue with eccentric exercises to rehab his calf strain. Will see him for follow-up in 4 weeks for reimaging.  Peter Quan, MD [facility] Sports Medicine Fellow 08/15/2018 2:19 PM  I observed and examined the patient with the [facility] Fellow and supervised the injection of Baker's cyst under [place].  I agree with assessment and plan.  Note reviewed and modified by me. Peter Big, MD

## 2018-09-05 ENCOUNTER — Ambulatory Visit: Payer: BLUE CROSS/BLUE SHIELD | Admitting: Sports Medicine

## 2018-09-05 ENCOUNTER — Encounter

## 2018-09-05 ENCOUNTER — Encounter: Payer: Self-pay | Admitting: Sports Medicine

## 2018-09-05 VITALS — BP 140/68 | Ht 73.0 in | Wt 235.0 lb

## 2018-09-05 DIAGNOSIS — M7122 Synovial cyst of popliteal space [Baker], left knee: Secondary | ICD-10-CM | POA: Diagnosis not present

## 2018-09-05 DIAGNOSIS — G8929 Other chronic pain: Secondary | ICD-10-CM | POA: Diagnosis not present

## 2018-09-05 DIAGNOSIS — M23204 Derangement of unspecified medial meniscus due to old tear or injury, left knee: Secondary | ICD-10-CM | POA: Insufficient documentation

## 2018-09-05 DIAGNOSIS — M25562 Pain in left knee: Secondary | ICD-10-CM

## 2018-09-05 MED ORDER — DICLOFENAC SODIUM 75 MG PO TBEC: 75.0000 mg | DELAYED_RELEASE_TABLET | Freq: Two times a day (BID) | ORAL | 2 refills | Status: DC

## 2018-09-05 NOTE — Assessment & Plan Note (Signed)
Some response to CSI  Will check MRI to be sure of cause and any more serious IA injury

## 2018-09-05 NOTE — Progress Notes (Signed)
CC; Left knee pain  Left Baker's cyst drained and injected on 10/22 This came after pain while running at tennis court Felt slight twist in knee and some calf pain  CSI gave him about 5 days of excellent relief but pain has now returned not to poserior knee but now to medial knee  Prior XR of knee did not show significant OA/ just some mild loss of joint space  Past Hx of arthroscopic surgery on both knees  Comes for reassessment  ROS No locking No givng way By end of day knee feels tight and he may be noting some swelling  PE Gen - NAD, pleasant W M BP 140/68   Ht 6\' 1"  (1.854 m)   Wt 235 lb (106.6 kg)   BMI 31.00 kg/m   Knee: Normal to inspection x possible small effusion SPP No obvious bony abnormalities. Palpation shows no warmth or patellar tenderness or condyle tenderness. Mild TTP along medial joint line ROM normal in flexion and extension but pain at 125 deg of flexion Ligaments with solid consistent endpoints including ACL, PCL, LCL, MCL. Positive Mcmurray's and provocative meniscal tests. Non painful patellar compression. Patellar and quadriceps tendons unremarkable. Hamstring and quadriceps strength is normal.  Ultrasound of Left Knee Moderate effusion in SPP Degenerative changes in medial meniscus and small fragment noted Some perimeniscal swelling Lateral meniscus intact Quadriceps and pateallar tendons intact Baker's cyst is noted along medial gastroc tendon but also a second cyst at semimembranosus tendon - these are about 50% size noted before  Impression - probable degenerative meniscus with effusion;  Baker's cyst; Cannot rule out OCD lesion  Ultrasound and interpretation by Royal HawthornKarl B. Darrick PennaFields, MD

## 2018-09-05 NOTE — Assessment & Plan Note (Signed)
With his degree of pain and effusion we will order MRI to see if meniscus tear is deeper or possible OCD lesion  Compression sleeve Ice Avoid weight bearing exercise but XT on bike Reck post MRI

## 2018-09-06 DIAGNOSIS — M94262 Chondromalacia, left knee: Secondary | ICD-10-CM | POA: Diagnosis not present

## 2018-09-06 DIAGNOSIS — M6752 Plica syndrome, left knee: Secondary | ICD-10-CM | POA: Diagnosis not present

## 2018-09-06 DIAGNOSIS — S83242A Other tear of medial meniscus, current injury, left knee, initial encounter: Secondary | ICD-10-CM | POA: Diagnosis not present

## 2018-09-06 DIAGNOSIS — M660 Rupture of popliteal cyst: Secondary | ICD-10-CM | POA: Diagnosis not present

## 2018-09-06 DIAGNOSIS — S83282A Other tear of lateral meniscus, current injury, left knee, initial encounter: Secondary | ICD-10-CM | POA: Diagnosis not present

## 2018-09-07 DIAGNOSIS — L814 Other melanin hyperpigmentation: Secondary | ICD-10-CM | POA: Diagnosis not present

## 2018-09-07 DIAGNOSIS — L84 Corns and callosities: Secondary | ICD-10-CM | POA: Diagnosis not present

## 2018-09-07 DIAGNOSIS — R209 Unspecified disturbances of skin sensation: Secondary | ICD-10-CM | POA: Diagnosis not present

## 2018-09-07 DIAGNOSIS — R238 Other skin changes: Secondary | ICD-10-CM | POA: Diagnosis not present

## 2018-09-07 DIAGNOSIS — D225 Melanocytic nevi of trunk: Secondary | ICD-10-CM | POA: Diagnosis not present

## 2018-09-07 DIAGNOSIS — R208 Other disturbances of skin sensation: Secondary | ICD-10-CM | POA: Diagnosis not present

## 2018-09-07 DIAGNOSIS — L821 Other seborrheic keratosis: Secondary | ICD-10-CM | POA: Diagnosis not present

## 2018-09-07 DIAGNOSIS — L820 Inflamed seborrheic keratosis: Secondary | ICD-10-CM | POA: Diagnosis not present

## 2018-09-07 DIAGNOSIS — L570 Actinic keratosis: Secondary | ICD-10-CM | POA: Diagnosis not present

## 2018-09-13 ENCOUNTER — Encounter: Payer: Self-pay | Admitting: Sports Medicine

## 2018-09-18 DIAGNOSIS — R7303 Prediabetes: Secondary | ICD-10-CM | POA: Diagnosis not present

## 2018-09-18 DIAGNOSIS — S83242A Other tear of medial meniscus, current injury, left knee, initial encounter: Secondary | ICD-10-CM | POA: Diagnosis not present

## 2018-09-18 DIAGNOSIS — I2510 Atherosclerotic heart disease of native coronary artery without angina pectoris: Secondary | ICD-10-CM | POA: Diagnosis not present

## 2018-09-18 DIAGNOSIS — E039 Hypothyroidism, unspecified: Secondary | ICD-10-CM | POA: Diagnosis not present

## 2018-09-18 DIAGNOSIS — Z1159 Encounter for screening for other viral diseases: Secondary | ICD-10-CM | POA: Diagnosis not present

## 2018-09-19 ENCOUNTER — Ambulatory Visit: Payer: BLUE CROSS/BLUE SHIELD | Admitting: Sports Medicine

## 2018-09-19 ENCOUNTER — Encounter: Payer: Self-pay | Admitting: Sports Medicine

## 2018-09-19 DIAGNOSIS — M23204 Derangement of unspecified medial meniscus due to old tear or injury, left knee: Secondary | ICD-10-CM | POA: Diagnosis not present

## 2018-09-19 NOTE — Assessment & Plan Note (Signed)
While I think his tear is primarily degenerative his mechanical symptoms and pain are significant  After discussing his options I think he would like to go ahead with a surgical opinion  I will refer to [facility] - Dr Luiz BlareGraves or Jerl Santosalldorf

## 2018-09-19 NOTE — Progress Notes (Signed)
Chief complaint left knee pain  Patient returns for follow-up of his left knee pain after having had an MRI. The MRI shows significant meniscal tearing that is probably primarily degenerative. This is more extensive on the medial compartment There is some along the lateral compartment as well. Joint spaces reasonably well preserved.  Patient feels that the swelling has gone down some particularly since our injection of his Baker's cyst. However he still feels that this is unstable coming down steps or trying to stand on his left leg to go up a step. He still gets some intermittent sharp pain particularly on the medial side. Bending down or trying to do yard work or tennis are all too uncomfortable.  Past history Knee surgery on the right knee that involved meniscus but possibly other structures when he was 62 years old Arthroscopic surgery on his left knee for torn meniscus at age 62.  Review of systems Moderate swelling of the left knee by the end of the day No redness No locking  Physical examination Pleasant white male in no acute distress, moderately obese BP 120/62   Left knee  on inspection appears to have a small effusion Ligamentous testing is stable Tenderness to palpation along the medial joint line Extension is full but flexion becomes painful at 130 degrees McMurray test is painful  MRI were reviewed with the patient

## 2018-09-19 NOTE — Patient Instructions (Signed)
[facility] [address], [place], [place] [address] Phone: [phone]  Dr Cindee LamePete [facility] Monday December 2nd at 445p Arrival time is 415p

## 2018-09-25 DIAGNOSIS — M25562 Pain in left knee: Secondary | ICD-10-CM | POA: Diagnosis not present

## 2018-10-02 DIAGNOSIS — Z1211 Encounter for screening for malignant neoplasm of colon: Secondary | ICD-10-CM | POA: Diagnosis not present

## 2018-10-02 DIAGNOSIS — Z1212 Encounter for screening for malignant neoplasm of rectum: Secondary | ICD-10-CM | POA: Diagnosis not present

## 2018-10-05 DIAGNOSIS — R001 Bradycardia, unspecified: Secondary | ICD-10-CM | POA: Diagnosis not present

## 2018-10-05 DIAGNOSIS — I2510 Atherosclerotic heart disease of native coronary artery without angina pectoris: Secondary | ICD-10-CM | POA: Diagnosis not present

## 2018-10-12 DIAGNOSIS — S83232A Complex tear of medial meniscus, current injury, left knee, initial encounter: Secondary | ICD-10-CM | POA: Diagnosis not present

## 2018-10-12 DIAGNOSIS — M94262 Chondromalacia, left knee: Secondary | ICD-10-CM | POA: Diagnosis not present

## 2018-10-12 DIAGNOSIS — G8918 Other acute postprocedural pain: Secondary | ICD-10-CM | POA: Diagnosis not present

## 2018-10-12 DIAGNOSIS — M6752 Plica syndrome, left knee: Secondary | ICD-10-CM | POA: Diagnosis not present

## 2018-10-12 DIAGNOSIS — M2242 Chondromalacia patellae, left knee: Secondary | ICD-10-CM | POA: Diagnosis not present

## 2018-10-16 DIAGNOSIS — M25562 Pain in left knee: Secondary | ICD-10-CM | POA: Diagnosis not present

## 2018-10-16 DIAGNOSIS — Z9889 Other specified postprocedural states: Secondary | ICD-10-CM | POA: Diagnosis not present

## 2018-10-31 ENCOUNTER — Ambulatory Visit: Payer: BLUE CROSS/BLUE SHIELD | Admitting: Sports Medicine

## 2018-10-31 ENCOUNTER — Encounter: Payer: Self-pay | Admitting: Sports Medicine

## 2018-10-31 VITALS — BP 142/74 | Ht 73.0 in | Wt 220.0 lb

## 2018-10-31 DIAGNOSIS — M5459 Other low back pain: Secondary | ICD-10-CM

## 2018-10-31 DIAGNOSIS — Z9889 Other specified postprocedural states: Secondary | ICD-10-CM | POA: Diagnosis not present

## 2018-10-31 DIAGNOSIS — M25662 Stiffness of left knee, not elsewhere classified: Secondary | ICD-10-CM | POA: Diagnosis not present

## 2018-10-31 DIAGNOSIS — M5126 Other intervertebral disc displacement, lumbar region: Secondary | ICD-10-CM | POA: Diagnosis not present

## 2018-10-31 DIAGNOSIS — M545 Low back pain: Secondary | ICD-10-CM | POA: Diagnosis not present

## 2018-10-31 DIAGNOSIS — M25562 Pain in left knee: Secondary | ICD-10-CM | POA: Diagnosis not present

## 2018-10-31 NOTE — Patient Instructions (Signed)
Thank you for coming to see Peter Huffman today in clinic.  You were seen today for low back pain.  We recommend that you start low back physical therapy, in addition to the knee physical therapy you are to you are undergoing.  This physical therapy should include core strengthening, as well as stretches for the low back.  We will see you for follow-up as needed in this clinic moving forward.

## 2018-10-31 NOTE — Progress Notes (Signed)
   HPI  CC: Low back pain  Peter Huffman is a 63 year old male who presents for low back pain.  This low back pain is been going on for multiple years.  He had an MRI that was performed in early 2019 which showed multiple small disc herniations.  He states his low back pain did get better, until his left knee began to bother him.  He states that while he was dealing with his left knee, he was having the pressure off more than his upper extremities which is causing worsening low back pain.  He states that since he had his left meniscus repaired on December 19, his low back pain has been getting progressively better.  He states he continues to have pain in his lower lumbar spine as well as his SI joints.  He does have an achy pain that goes down the inner part of his left thigh and goes into his second toe.  He denies any numbness or tingling.  Denies any weakness in the leg.  He denies any balance issues.  He is not had a recent falls.  He has no trauma to the area.  He previously tried gabapentin and amitriptyline, which did not seem to help at all with the pain.  He states the pain is made worse when he sits for prolonged period of time.  He states is also made worse when he does any quick movements wise on the tennis court.  He denies any fevers or chills.  See HPI and/or previous note for associated ROS.  Objective: BP (!) 142/74   Ht 6\' 1"  (1.854 m)   Wt 220 lb (99.8 kg)   BMI 29.03 kg/m  Gen: right-Hand Dominant. NAD, well groomed, a/o x3, normal affect.  CV: Well-perfused. Warm.  Resp: Non-labored.  Neuro: Sensation intact throughout. No gross coordination deficits.  Patellar and Achilles reflexes 2+ bilaterally. Gait: Nonpathologic posture, unremarkable stride without signs of limp or balance issues.  Back exam: No erythema, warmth, swelling noted.  Tenderness palpation over the paraspinal muscles of the L4-L5 region.  Tenderness palpation over the bilateral SI joints.  Full range of motion in  flexion extension of the spine.  Full range of motion side to side motions of the spine.  No pain elicited with range of motion.  Strength out of 5 throughout lower extremity testing.  Negative straight leg raise bilaterally.  Negative FABER and FADIR testing.  Negative logroll test.  Assessment and plan: Mechanical Low back pain  We discussed treatment options today.  At this time, we will start him in physical therapy for mechanical low back pain.  This includes core strengthening as well as low back stretches.  This been in addition to the physical therapy he is already undergoing for his left knee following arthroscopic of the left knee.  He did not tolerate amitriptyline or gabapentin previously, and is without radicular symptoms today.  He can use NSAIDs as needed for pain relief.  We will see him back for follow-up as needed in clinic.  Alric QuanBlake Dixon, MD [facility] Fellow 10/31/2018 10:09 AM  I observed and examined the patient with the [facility] Fellow and agree with assessment and plan.  Note reviewed and modified by me. Enid BaasKarl Donita Newland, MD

## 2018-11-06 DIAGNOSIS — M25562 Pain in left knee: Secondary | ICD-10-CM | POA: Diagnosis not present

## 2018-11-16 DIAGNOSIS — M25562 Pain in left knee: Secondary | ICD-10-CM | POA: Diagnosis not present

## 2018-11-16 DIAGNOSIS — M25662 Stiffness of left knee, not elsewhere classified: Secondary | ICD-10-CM | POA: Diagnosis not present

## 2018-11-16 DIAGNOSIS — Z9889 Other specified postprocedural states: Secondary | ICD-10-CM | POA: Diagnosis not present

## 2018-11-22 DIAGNOSIS — Z9889 Other specified postprocedural states: Secondary | ICD-10-CM | POA: Diagnosis not present

## 2018-11-22 DIAGNOSIS — M25662 Stiffness of left knee, not elsewhere classified: Secondary | ICD-10-CM | POA: Diagnosis not present

## 2018-11-22 DIAGNOSIS — M25562 Pain in left knee: Secondary | ICD-10-CM | POA: Diagnosis not present

## 2018-12-04 DIAGNOSIS — M25562 Pain in left knee: Secondary | ICD-10-CM | POA: Diagnosis not present

## 2018-12-11 ENCOUNTER — Other Ambulatory Visit: Payer: Self-pay

## 2018-12-11 MED ORDER — DICLOFENAC SODIUM 75 MG PO TBEC: 75.0000 mg | DELAYED_RELEASE_TABLET | Freq: Two times a day (BID) | ORAL | 5 refills | Status: AC

## 2018-12-14 ENCOUNTER — Ambulatory Visit: Payer: BLUE CROSS/BLUE SHIELD | Admitting: Sports Medicine

## 2018-12-14 ENCOUNTER — Encounter: Payer: Self-pay | Admitting: Sports Medicine

## 2018-12-14 VITALS — BP 132/74 | Ht 73.0 in | Wt 220.0 lb

## 2018-12-14 DIAGNOSIS — M533 Sacrococcygeal disorders, not elsewhere classified: Secondary | ICD-10-CM | POA: Diagnosis not present

## 2018-12-14 DIAGNOSIS — G8929 Other chronic pain: Secondary | ICD-10-CM | POA: Diagnosis not present

## 2018-12-14 DIAGNOSIS — M5126 Other intervertebral disc displacement, lumbar region: Secondary | ICD-10-CM | POA: Diagnosis not present

## 2018-12-14 DIAGNOSIS — M545 Low back pain: Secondary | ICD-10-CM

## 2018-12-14 DIAGNOSIS — M5459 Other low back pain: Secondary | ICD-10-CM

## 2018-12-14 NOTE — Patient Instructions (Signed)
Your pain today seems more consistent with sacroiliac joint pain -Continue diclofenac as needed - Start doing the "pretzel" stretches as was demonstrated today -Also begin the standing hip rotations demonstrated today -Follow-up in 6 weeks

## 2018-12-14 NOTE — Progress Notes (Signed)
  Peter Huffman - 63 y.o. male MRN [id number]  Date of birth: [birth year]    SUBJECTIVE:      Chief Complaint: Back pain  HPI:  63 year old male with low back pain presents today for follow-up.  He does note some mild improvement in the pain.  Overall, his pain is been present for about 2 years.  He has been doing physical therapy.  He notes that this is primarily been home rehab under the guidance of his physical therapist.  Today he localizes his pain around the sacral region to the left and right of midline.  His pain is made worse with standing from a seated position or occasionally when reaching out away from his body.  He does describe occasional, brief radiation of pain down the back of his leg to the knee.  Does not pass below the knee to the foot.  This is intermittent fairly inconsistent.  Occasionally happens with extension of his leg but is not readily reproducible.  He also describes what he feels is an instability of his low back/hips.  He has again begun taking diclofenac as needed with pain which is somewhat helpful. - No red flag symptoms.  No distal numbness or tingling.  No lower extremity weakness.   ROS:     See HPI  PERTINENT  PMH / PSH FH / / SH:  Past Medical, Surgical, Social, and Family History Reviewed & Updated in the EMR.   OBJECTIVE: BP 132/74   Ht 6\' 1"  (1.854 m)   Wt 220 lb (99.8 kg)   BMI 29.03 kg/m   Physical Exam:  Vital signs are reviewed.  GEN: Alert and oriented, NAD Pulm: Breathing unlabored PSY: normal mood, congruent affect  MSK: Lumbar spine: - Inspection: no gross deformity or asymmetry, swelling or ecchymosis - Palpation: No TTP over the spinous processes, paraspinal muscles.  He has reproducible pain with palpation of the SI joints bilaterally. - ROM: full active ROM of the lumbar spine in flexion and extension without pain - Strength: 5/5 strength of lower extremity in L4-S1 nerve root distributions b/l - Neuro: sensation intact in the  L4-S1 nerve root distribution b/l - Special testing: Negative straight leg raise  Bilateral hips: No tenderness over the greater trochanter bilaterally Full range of motion Symmetric strength bilaterally Normal FABER on the right, there is less range of motion and tightness with FABER on the left but without pain. Left hip tight on pretzel stretch  ASSESSMENT & PLAN:  1.  Low back pain-this now seems more consistent with SI joint mediated pain.  On review his of his MRI his degenerative changes/bulging disc do not correspond with the brief radiating pain he feels.  This is also likely related to his SI joint. -Continue diclofenac as needed -Continue physical therapy -May resume activity/tennis as tolerated - Advised to add "pretzel stretches" as well as dynamic standing hip rotation exercises -Follow-up 6 weeks  I observed and examined the patient with the [facility] Fellow and agree with assessment and plan.  Note reviewed and modified by me. Sterling Big, MD

## 2018-12-29 DIAGNOSIS — M5126 Other intervertebral disc displacement, lumbar region: Secondary | ICD-10-CM | POA: Diagnosis not present

## 2018-12-29 DIAGNOSIS — M545 Low back pain: Secondary | ICD-10-CM | POA: Diagnosis not present

## 2018-12-29 DIAGNOSIS — M25562 Pain in left knee: Secondary | ICD-10-CM | POA: Diagnosis not present

## 2019-01-01 DIAGNOSIS — M25562 Pain in left knee: Secondary | ICD-10-CM | POA: Diagnosis not present

## 2019-01-12 DIAGNOSIS — I809 Phlebitis and thrombophlebitis of unspecified site: Secondary | ICD-10-CM | POA: Diagnosis not present

## 2019-01-19 DIAGNOSIS — L03116 Cellulitis of left lower limb: Secondary | ICD-10-CM | POA: Diagnosis not present

## 2019-01-19 DIAGNOSIS — I872 Venous insufficiency (chronic) (peripheral): Secondary | ICD-10-CM | POA: Diagnosis not present

## 2019-01-25 ENCOUNTER — Ambulatory Visit: Payer: BLUE CROSS/BLUE SHIELD | Admitting: Sports Medicine

## 2019-02-04 DIAGNOSIS — L03116 Cellulitis of left lower limb: Secondary | ICD-10-CM | POA: Diagnosis not present

## 2019-02-09 DIAGNOSIS — L03116 Cellulitis of left lower limb: Secondary | ICD-10-CM | POA: Diagnosis not present

## 2019-02-09 DIAGNOSIS — I872 Venous insufficiency (chronic) (peripheral): Secondary | ICD-10-CM | POA: Diagnosis not present

## 2019-03-06 ENCOUNTER — Encounter: Payer: Self-pay | Admitting: Sports Medicine

## 2019-03-06 ENCOUNTER — Other Ambulatory Visit: Payer: Self-pay

## 2019-03-06 ENCOUNTER — Ambulatory Visit (INDEPENDENT_AMBULATORY_CARE_PROVIDER_SITE_OTHER): Payer: BLUE CROSS/BLUE SHIELD | Admitting: Sports Medicine

## 2019-03-06 DIAGNOSIS — M25551 Pain in right hip: Secondary | ICD-10-CM

## 2019-03-06 DIAGNOSIS — M25552 Pain in left hip: Secondary | ICD-10-CM | POA: Diagnosis not present

## 2019-03-06 NOTE — Assessment & Plan Note (Signed)
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

## 2019-03-06 NOTE — Patient Instructions (Signed)
My suggestions  Get some regular schedule about hitting Adjust this to your level of stiffness and pain  Abdominals Work to do crunches in 3 positions Starting point is only 10 in each position Goal is to increase by 5 reps each week to 10 days  Hips Need to add weights at ankle I'm guessing build to 8 to 10 lbs 3 core exercises -  Lateral leg lift  Lateral step up and cross over step up Standing rotation  SI Joint stretches Pretzel stretch Pelvic tilt Keep up figure of 4 stretch  This has to become a routine at least 5 days per week  RTC 58months

## 2019-03-06 NOTE — Progress Notes (Signed)
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 Now hitting sessions up to 1 hour Frustrated that each time he makes progress seems to return to having pain over both sides of sacral low back area This is worse when he tries to hie a high level ball  Last PT visit was 3/6 More focus on piriformis and SIJ We had added SIJ stretches on visit to [place] 2/20 HEP sounds very intermittent but does a series of hip and LB exercises  4/18 had MRI of RT hip with some tendinosis but no significant DJD Low back evaluation in past shows some mild low lumbar DDD  Past Hx Meniscus surgery 12/19 by Dr Jerl Santos  ROS No sciatica No weakness in lower legs Voluntary weight loss of 19 lbs  PE Overweight W male in NAD BP (!) 145/74   Ht 6\' 1"  (1.854 m)   Wt 219 lb (99.3 kg)   BMI 28.89 kg/m    SLR neg Hip ROM is normal HS strength excellent Hip extension strength good Hip abduction strength is only 4/5 bilaterally Hip flexion is good Quad strength is good  SI joints tight with poor motion Hard to open on pretzel stretch Normal FABER Crunches are difficult

## 2019-03-22 ENCOUNTER — Encounter

## 2019-06-05 ENCOUNTER — Ambulatory Visit (INDEPENDENT_AMBULATORY_CARE_PROVIDER_SITE_OTHER): Payer: BC Managed Care – PPO | Admitting: Sports Medicine

## 2019-06-05 ENCOUNTER — Encounter: Payer: Self-pay | Admitting: Sports Medicine

## 2019-06-05 ENCOUNTER — Other Ambulatory Visit: Payer: Self-pay

## 2019-06-05 DIAGNOSIS — M5136 Other intervertebral disc degeneration, lumbar region: Secondary | ICD-10-CM | POA: Diagnosis not present

## 2019-06-05 NOTE — Progress Notes (Signed)
CC: Low back pain  Patient comes for evaluation of a flare of LBP He now has 2.5 years of recurrent LBP During this time he has utilized PT He has also been on HEP Both of these help get him through acute flares  On this flare starting almost 4 weeks ago he had to walk with back flexed He had pain with sitting up straight Pain radiates into both buttocks Pain to left post thigh stopping at knee on a few occassions  6/ 2018 LS MRI showed multilevel DDD and some osteophyte changes  ROS No sciatica No weakness No bowel or bladder issues  Exam Obese W M in NAD BP 138/82   Ht 6\' 1"  (1.854 m)   Wt 226 lb (102.5 kg)   BMI 29.82 kg/m    Centripetal obesity pattern Full ROM on flexion and lumbar motions with slight discomfort on extension Knee and ankle DTRs preserved bilat Heel walk, toe walk and tandem walk without problems No weakness noted in lower extremities SLR to 60 deg bilat. Without pain Knee to chest and shoulder stretch feels good with no increase in pain

## 2019-06-05 NOTE — Assessment & Plan Note (Signed)
After 2.5 years of flares he would like to see a spine surgeon to see if he has any options He is very frustrated by inability to get back to  Tennis He has been a teaching tennis pro  Today he is recovering from what sound like discogenic sxs.  Recommended continuing HEP with abdominals and with knee to chest exercises  Suggested tennis specific drills and avoiding back extension with play  Refer for opinion from Dr Lynann Bologna  Will reassess after referral

## 2019-09-04 ENCOUNTER — Ambulatory Visit: Payer: Managed Care, Other (non HMO) | Admitting: Sports Medicine

## 2019-09-04 ENCOUNTER — Other Ambulatory Visit: Payer: Self-pay

## 2019-09-04 DIAGNOSIS — M5136 Other intervertebral disc degeneration, lumbar region: Secondary | ICD-10-CM

## 2019-09-04 MED ORDER — METHOCARBAMOL 500 MG PO TABS: 500.0000 mg | ORAL_TABLET | Freq: Four times a day (QID) | ORAL | 2 refills | Status: AC

## 2019-09-04 NOTE — Progress Notes (Signed)
  Peter Huffman - 63 y.o. male MRN [id number]  Date of birth: 06-18-1956  SUBJECTIVE:   CC: Patient returns for evaluation of lower back pain. Pain present for last 2.5 years. He has tried PT, home exercises, 2 MRI's of lumbar spine with last 1 year ago.  As discussed at previous office visit, patient has decreased his daily home exercise program to every other day and decreased playing tennis to avoid further stretching lower back.  Had discussed referral to spine surgeon on previous visit, however patient did not receive referral.  Patient states that low back pain has actually worsened over the past 2 to 3 weeks.  States that pain begins in the lumbar spine radiating to both hips posteriorly.  Denies radiation down legs.  Denies weakness/numbness/tingling to lower extremities.  Denies urinary incontinence and saddle paresthesia.  He has taken ibuprofen as needed when pain is worse.   ROS: No unexpected weight loss, fever, chills, swelling, instability, muscle pain, numbness/tingling, redness, otherwise see HPI   PMHx - Updated and reviewed.  Contributory factors include: Negative PSHx - Updated and reviewed.  Contributory factors include:  Negative FHx - Updated and reviewed.  Contributory factors include:  Negative Social Hx - Updated and reviewed. Contributory factors include: Negative Medications - reviewed   DATA REVIEWED: N/A  PHYSICAL EXAM:  VS: BP:134/84  HR: bpm  TEMP: ( )  RESP:   HT:6\' 1"  (185.4 cm)   WT:226 lb (102.5 kg)  BMI:29.82 PHYSICAL EXAM: Gen: NAD, alert, cooperative with exam, well-appearing  Centripetal obesity pattern Full ROM on flexion and lumbar motions with slight discomfort on extension Knee and ankle DTRs preserved bilat Heel walk, toe walk and tandem walk without problems No weakness noted in lower extremities SLR to 60 deg bilat Without pain Knee to chest and shoulder stretch feels good with no increase in pain Negative Fabere bilaterally   ASSESSMENT & PLAN:   #Low back pain #Lumbar DDD -Chronic and she has been no improvement since previous visit -Start Robaxin 500 mg 4 times daily, and may decrease daily usage as needed -Start 4 exercises every other day that most alleviate patient's symptoms and walk 10,000 steps every other day -Reevaluate in 6-week after new HEP and muscle relaxer treatments -May discuss referral to spine surgeon at that time if no improvement.  Portions of this note were written by medical resident, Glennon Mac, PGY 3.  I observed and examined the patient with the resident and agree with assessment and plan.  Note reviewed and modified by me. Ila Mcgill, MD

## 2019-09-04 NOTE — Assessment & Plan Note (Signed)
Continues to have same sxs Will modify and shift to program of: Increased walking Selected extension and flexion exercises Trial of robaxin  Consider consult if no change

## 2019-10-16 ENCOUNTER — Ambulatory Visit: Payer: Managed Care, Other (non HMO) | Admitting: Sports Medicine

## 2020-10-16 ENCOUNTER — Ambulatory Visit (INDEPENDENT_AMBULATORY_CARE_PROVIDER_SITE_OTHER): Payer: Managed Care, Other (non HMO) | Admitting: Sports Medicine

## 2020-10-16 DIAGNOSIS — M5136 Other intervertebral disc degeneration, lumbar region: Secondary | ICD-10-CM | POA: Diagnosis not present

## 2020-10-16 NOTE — Progress Notes (Signed)
Chief complaint -chronic low back pain  Patient has had low back pain that has kept him from really playing tennis since 2018 In 2018 his MRI showed degenerative changes in his discs and osteophytes starting at L1 to and going all the way to L4-5 L5-S1 was mild  He has responded to home exercises and some physical therapy exercises to where he can do normal activities of daily living without pain  He is now working in [employer] for [employer]. 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 He realizes he has gained a lot of weight since that time  When he plays tennis he can hit for hands without significant pain He can [place] without pain To hand back and causes pain to his lower lumbar area Serve and overhead cause pain if he does any back extension  Review of systems No sciatica No lower extremity weakness No bowel or bladder complaints  We addressed some distal biceps tendon pain on the right We addressed some tingling in his left fifth finger when hitting backhand  Physical exam Obese white male who is in no acute distress BP (!) 150/66   Ht 6\' 1"  (1.854 m)   Wt 230 lb (104.3 kg)   BMI 30.34 kg/m   Back extension to 30 degrees without pain Back flexion is normal without pain Lateral bend and rotation is normal without pain  Palpation of the SI joints in the lower lumbar spine causes no pain  He can do heel and toe walking without any sign of weakness Neurologic testing is unremarkable

## 2020-10-16 NOTE — Assessment & Plan Note (Signed)
He has significant degenerative change in his lumbar spine To be more active he needs to be on a progressive program perhaps starting with physical therapy but hopefully progressing into Pilates or some ongoing activity  Since he works in [place] I would like to have him evaluated by my colleague Dr. Estelle Grumbles who could connect him with physical therapy at [facility] with a goal of getting him back to where he can play tennis  We will make this referral over the holidays

## 2022-06-24 ENCOUNTER — Ambulatory Visit (INDEPENDENT_AMBULATORY_CARE_PROVIDER_SITE_OTHER): Payer: Managed Care, Other (non HMO) | Admitting: Sports Medicine

## 2022-06-24 DIAGNOSIS — M5136 Other intervertebral disc degeneration, lumbar region: Secondary | ICD-10-CM | POA: Diagnosis not present

## 2022-06-24 NOTE — Progress Notes (Signed)
Peter Huffman - 66 y.o. male MRN [id number]  Date of birth: 01/30/1956    CHIEF COMPLAINT:   Left foot and back pain    SUBJECTIVE:   HPI: Pleasant 66 year old male comes in to be evaluated for left foot pain and back pain.  He has unfortunately been dealing with back pain for a number of years now, dating back to 2018.  He has degenerative disc disease in his lumbar spine.  He was evaluated by a surgeon in [place] who recommended nonoperative management.  He has since been working with physical therapy, and daily walking regimen 3 to 6 miles a day.  Unfortunately he is still not able to play tennis comfortably due to limitations in his back.  Most of his pain is medial on in the lower lumbar area.  He denies any radicular symptoms or numbness tingling in the legs.  In regards to his left foot, this also has been painful for a number of months.  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  He has tried wearing shoes with a insert with a metatarsal pad but does not think this is working.  He has lost about 40 pounds of weight, which he was congratulated on, but he still frustrated as he wants to return to being able to play tennis at a high level.   ROS:     See HPI  PERTINENT  PMH / PSH FH / / SH:  Past Medical, Surgical, Social, and Family History Reviewed & Updated in the EMR.  Pertinent findings include:  none  OBJECTIVE: BP 130/64   Wt 212 lb (96.2 kg)   BMI 27.97 kg/m   Physical Exam:  Vital signs are reviewed.  GEN: Alert and oriented, NAD Pulm: Breathing unlabored PSY: normal mood, congruent affect  MSK: Spine -no obvious scoliosis of the spine.  There is no overlying erythema of the lumbar spine.  He is nontender to palpation at spinous processes, no bony step-off.  He has full range of motion in flexion and extension.  He has negative straight leg raise bilaterally.  R Hip -no overlying erythema.  Nontender to  palpation of the greater trochanter.  Internal rotation limited to 10 degrees.  External rotation to 30 degrees.  5/5 strength with resisted hip flexion and abduction.  L Hip -no overlying erythema.  Nontender to palpation of the greater trochanter.  Internal rotation limited to 10 degrees.  External rotation to 30 degrees.  5/5 strength with resisted hip flexion and abduction.  L Foot -no erythema or ecchymoses or edema.  He does have a callus on the dorsal aspect of the third MTP joint and is tender to palpation there.  He is nontender to palpation at the medial malleolus, lateral malleolus,, base of the fifth met and navicular.  He is nontender to palpation at the plantar fascia insertion on the calcaneus.  Full range of motion and good strength of the ankle.  Negative windlass test.  Neurovascularly intact.  Bilat Feet Analysis -splaying of the forefoot, most prominent of the first and second toes, consistent with collapse of the transverse arch.  Moderate height of the longitudinal arch.  Bunionette's left fifth toes bilaterally    ASSESSMENT & PLAN:  1. Lumbar Degenerative Disc Disease -He will continue conservative treatment for this with the addition of core stabilization exercises and specific tennis targeted hip mobility/rotation exercises.  He will start his  hip mobility/rotation with a 5 pound dumbbell to imitate tennis shots.  He can return to tennis as tolerated. Follow-up 6 weeks to see how this is doing.  2. Metatarsalgia -We discussed that he likely needs more padding along the metatarsals to cushion his weight when playing tennis.  He will return to clinic for custom red sport orthotic with extra cushioning - smart cell type of blanke  Dortha Kern, MD PGY-4, Sports Medicine Fellow [place]  I observed and examined the patient with the resident and agree with assessment and plan.  Note reviewed and modified by me. Ila Mcgill, MD

## 2022-06-24 NOTE — Assessment & Plan Note (Signed)
Today we reviewed and want to start him on a more aggressive HEP Cont walking With back exercises and tennis specific exercise we hope he can gradually return to tennis
